# Patient Record
Sex: Female | Born: 1985 | Race: White | Hispanic: No | Marital: Single | Smoking: Current every day smoker
Health system: Southern US, Community
[De-identification: ages and names within clinical notes are randomized; demographics above are authoritative.]

## PROBLEM LIST (undated history)

## (undated) ENCOUNTER — Inpatient Hospital Stay (HOSPITAL_COMMUNITY): Payer: Self-pay

## (undated) DIAGNOSIS — D649 Anemia, unspecified: Secondary | ICD-10-CM

## (undated) DIAGNOSIS — N6310 Unspecified lump in the right breast, unspecified quadrant: Secondary | ICD-10-CM

## (undated) DIAGNOSIS — N200 Calculus of kidney: Secondary | ICD-10-CM

## (undated) HISTORY — DX: Unspecified lump in the right breast, unspecified quadrant: N63.10

## (undated) HISTORY — PX: NO PAST SURGERIES: SHX2092

## (undated) HISTORY — DX: Anemia, unspecified: D64.9

---

## 2007-04-26 ENCOUNTER — Emergency Department (HOSPITAL_COMMUNITY): Admission: EM | Admit: 2007-04-26 | Discharge: 2007-04-26 | Payer: Self-pay | Admitting: Emergency Medicine

## 2016-07-27 ENCOUNTER — Encounter: Payer: Self-pay | Admitting: Adult Health

## 2016-07-27 ENCOUNTER — Ambulatory Visit (INDEPENDENT_AMBULATORY_CARE_PROVIDER_SITE_OTHER): Payer: Self-pay | Admitting: Adult Health

## 2016-07-27 VITALS — BP 100/60 | HR 68 | Ht 64.0 in | Wt 138.0 lb

## 2016-07-27 DIAGNOSIS — O3680X0 Pregnancy with inconclusive fetal viability, not applicable or unspecified: Secondary | ICD-10-CM

## 2016-07-27 DIAGNOSIS — N926 Irregular menstruation, unspecified: Secondary | ICD-10-CM

## 2016-07-27 DIAGNOSIS — R110 Nausea: Secondary | ICD-10-CM | POA: Diagnosis not present

## 2016-07-27 DIAGNOSIS — Z3490 Encounter for supervision of normal pregnancy, unspecified, unspecified trimester: Secondary | ICD-10-CM

## 2016-07-27 DIAGNOSIS — Z3201 Encounter for pregnancy test, result positive: Secondary | ICD-10-CM | POA: Diagnosis not present

## 2016-07-27 LAB — POCT URINE PREGNANCY: PREG TEST UR: POSITIVE — AB

## 2016-07-27 MED ORDER — OB COMPLETE PETITE 35-5-1-200 MG PO CAPS: ORAL_CAPSULE | ORAL | 11 refills | Status: DC

## 2016-07-27 NOTE — Patient Instructions (Signed)
First Trimester of Pregnancy  The first trimester of pregnancy is from week 1 until the end of week 12 (months 1 through 3). A week after a sperm fertilizes an egg, the egg will implant on the wall of the uterus. This embryo will begin to develop into a baby. Genes from you and your partner are forming the baby. The female genes determine whether the baby is a boy or a girl. At 6-8 weeks, the eyes and face are formed, and the heartbeat can be seen on ultrasound. At the end of 12 weeks, all the baby's organs are formed.   Now that you are pregnant, you will want to do everything you can to have a healthy baby. Two of the most important things are to get good prenatal care and to follow your health care provider's instructions. Prenatal care is all the medical care you receive before the baby's birth. This care will help prevent, find, and treat any problems during the pregnancy and childbirth.  BODY CHANGES  Your body goes through many changes during pregnancy. The changes vary from woman to woman.   · You may gain or lose a couple of pounds at first.  · You may feel sick to your stomach (nauseous) and throw up (vomit). If the vomiting is uncontrollable, call your health care provider.  · You may tire easily.  · You may develop headaches that can be relieved by medicines approved by your health care provider.  · You may urinate more often. Painful urination may mean you have a bladder infection.  · You may develop heartburn as a result of your pregnancy.  · You may develop constipation because certain hormones are causing the muscles that push waste through your intestines to slow down.  · You may develop hemorrhoids or swollen, bulging veins (varicose veins).  · Your breasts may begin to grow larger and become tender. Your nipples may stick out more, and the tissue that surrounds them (areola) may become darker.  · Your gums may bleed and may be sensitive to brushing and flossing.   · Dark spots or blotches (chloasma, mask of pregnancy) may develop on your face. This will likely fade after the baby is born.  · Your menstrual periods will stop.  · You may have a loss of appetite.  · You may develop cravings for certain kinds of food.  · You may have changes in your emotions from day to day, such as being excited to be pregnant or being concerned that something may go wrong with the pregnancy and baby.  · You may have more vivid and strange dreams.  · You may have changes in your hair. These can include thickening of your hair, rapid growth, and changes in texture. Some women also have hair loss during or after pregnancy, or hair that feels dry or thin. Your hair will most likely return to normal after your baby is born.  WHAT TO EXPECT AT YOUR PRENATAL VISITS  During a routine prenatal visit:  · You will be weighed to make sure you and the baby are growing normally.  · Your blood pressure will be taken.  · Your abdomen will be measured to track your baby's growth.  · The fetal heartbeat will be listened to starting around week 10 or 12 of your pregnancy.  · Test results from any previous visits will be discussed.  Your health care provider may ask you:  · How you are feeling.  · If you   are feeling the baby move.  · If you have had any abnormal symptoms, such as leaking fluid, bleeding, severe headaches, or abdominal cramping.  · If you are using any tobacco products, including cigarettes, chewing tobacco, and electronic cigarettes.  · If you have any questions.  Other tests that may be performed during your first trimester include:  · Blood tests to find your blood type and to check for the presence of any previous infections. They will also be used to check for low iron levels (anemia) and Rh antibodies. Later in the pregnancy, blood tests for diabetes will be done along with other tests if problems develop.  · Urine tests to check for infections, diabetes, or protein in the urine.   · An ultrasound to confirm the proper growth and development of the baby.  · An amniocentesis to check for possible genetic problems.  · Fetal screens for spina bifida and Down syndrome.  · You may need other tests to make sure you and the baby are doing well.  · HIV (human immunodeficiency virus) testing. Routine prenatal testing includes screening for HIV, unless you choose not to have this test.  HOME CARE INSTRUCTIONS   Medicines  · Follow your health care provider's instructions regarding medicine use. Specific medicines may be either safe or unsafe to take during pregnancy.  · Take your prenatal vitamins as directed.  · If you develop constipation, try taking a stool softener if your health care provider approves.  Diet  · Eat regular, well-balanced meals. Choose a variety of foods, such as meat or vegetable-based protein, fish, milk and low-fat dairy products, vegetables, fruits, and whole grain breads and cereals. Your health care provider will help you determine the amount of weight gain that is right for you.  · Avoid raw meat and uncooked cheese. These carry germs that can cause birth defects in the baby.  · Eating four or five small meals rather than three large meals a day may help relieve nausea and vomiting. If you start to feel nauseous, eating a few soda crackers can be helpful. Drinking liquids between meals instead of during meals also seems to help nausea and vomiting.  · If you develop constipation, eat more high-fiber foods, such as fresh vegetables or fruit and whole grains. Drink enough fluids to keep your urine clear or pale yellow.  Activity and Exercise  · Exercise only as directed by your health care provider. Exercising will help you:    Control your weight.    Stay in shape.    Be prepared for labor and delivery.  · Experiencing pain or cramping in the lower abdomen or low back is a good sign that you should stop exercising. Check with your health care provider  before continuing normal exercises.  · Try to avoid standing for long periods of time. Move your legs often if you must stand in one place for a long time.  · Avoid heavy lifting.  · Wear low-heeled shoes, and practice good posture.  · You may continue to have sex unless your health care provider directs you otherwise.  Relief of Pain or Discomfort  · Wear a good support bra for breast tenderness.    · Take warm sitz baths to soothe any pain or discomfort caused by hemorrhoids. Use hemorrhoid cream if your health care provider approves.    · Rest with your legs elevated if you have leg cramps or low back pain.  · If you develop varicose veins in your   legs, wear support hose. Elevate your feet for 15 minutes, 3-4 times a day. Limit salt in your diet.  Prenatal Care  · Schedule your prenatal visits by the twelfth week of pregnancy. They are usually scheduled monthly at first, then more often in the last 2 months before delivery.  · Write down your questions. Take them to your prenatal visits.  · Keep all your prenatal visits as directed by your health care provider.  Safety  · Wear your seat belt at all times when driving.  · Make a list of emergency phone numbers, including numbers for family, friends, the hospital, and police and fire departments.  General Tips  · Ask your health care provider for a referral to a local prenatal education class. Begin classes no later than at the beginning of month 6 of your pregnancy.  · Ask for help if you have counseling or nutritional needs during pregnancy. Your health care provider can offer advice or refer you to specialists for help with various needs.  · Do not use hot tubs, steam rooms, or saunas.  · Do not douche or use tampons or scented sanitary pads.  · Do not cross your legs for long periods of time.  · Avoid cat litter boxes and soil used by cats. These carry germs that can cause birth defects in the baby and possibly loss of the fetus by miscarriage or stillbirth.   · Avoid all smoking, herbs, alcohol, and medicines not prescribed by your health care provider. Chemicals in these affect the formation and growth of the baby.  · Do not use any tobacco products, including cigarettes, chewing tobacco, and electronic cigarettes. If you need help quitting, ask your health care provider. You may receive counseling support and other resources to help you quit.  · Schedule a dentist appointment. At home, brush your teeth with a soft toothbrush and be gentle when you floss.  SEEK MEDICAL CARE IF:   · You have dizziness.  · You have mild pelvic cramps, pelvic pressure, or nagging pain in the abdominal area.  · You have persistent nausea, vomiting, or diarrhea.  · You have a bad smelling vaginal discharge.  · You have pain with urination.  · You notice increased swelling in your face, hands, legs, or ankles.  SEEK IMMEDIATE MEDICAL CARE IF:   · You have a fever.  · You are leaking fluid from your vagina.  · You have spotting or bleeding from your vagina.  · You have severe abdominal cramping or pain.  · You have rapid weight gain or loss.  · You vomit blood or material that looks like coffee grounds.  · You are exposed to German measles and have never had them.  · You are exposed to fifth disease or chickenpox.  · You develop a severe headache.  · You have shortness of breath.  · You have any kind of trauma, such as from a fall or a car accident.     This information is not intended to replace advice given to you by your health care provider. Make sure you discuss any questions you have with your health care provider.     Document Released: 08/17/2001 Document Revised: 09/13/2014 Document Reviewed: 07/03/2013  Elsevier Interactive Patient Education ©2017 [employer] Inc.

## 2016-07-27 NOTE — Progress Notes (Signed)
Subjective:     Patient ID: Brittney Duncan, female   DOB: 06/22/1986, 30 y.o.   MRN: [id number]  HPI Brittney Duncan is a 30 year old white female in for UPT, has missed period and had 3+HPT.She has nausea if eats hot dogs. She has had right breat mass for about 6 months and had US about 6 weeks ago, and was recommended to get biopsy and then found out she was pregnant and did not get.Will request those records.  Review of Systems +missed period Nausea  Reviewed past medical,surgical, social and family history. Reviewed medications and allergies.     Objective:   Physical Exam BP 100/60 (BP Location: Left Arm, Patient Position: Sitting, Cuff Size: Normal)   Pulse 68   Ht 5\' 4"  (1.626 m)   Wt 138 lb (62.6 kg)   LMP 05/24/2016 (Exact Date)   BMI 23.69 kg/m    UPT + about 9+1 week by LMP with EDD 02/28/17. Skin warm and dry. Neck: mid line trachea, normal thyroid, good ROM, no lymphadenopathy noted. Lungs: clear to ausculation bilaterally. Cardiovascular: regular rate and rhythm.Abdomen is soft and non tender. PHQ 2 score 0.  Assessment:     1. Pregnancy examination or test, positive result   2. Pregnancy, unspecified gestational age   763. Encounter to determine fetal viability of pregnancy, single or unspecified fetus       Plan:     Meds ordered this encounter  Medications  . Prenat-FeCbn-FeAspGl-FA-Omega (OB COMPLETE PETITE) 35-5-1-200 MG CAPS    Sig: Take 1 daily    Dispense:  30 capsule    Refill:  11    Order Specific Question:   Supervising Provider    Answer:   Lazaro ArmsEURE, LUTHER H [[id number]]  Return in 1 week for dating US Review handout on first trimester Decrease smoking Request records on breast mass

## 2016-08-02 ENCOUNTER — Ambulatory Visit (INDEPENDENT_AMBULATORY_CARE_PROVIDER_SITE_OTHER): Payer: Medicaid Other

## 2016-08-02 DIAGNOSIS — Z3A09 9 weeks gestation of pregnancy: Secondary | ICD-10-CM | POA: Diagnosis not present

## 2016-08-02 DIAGNOSIS — O3680X0 Pregnancy with inconclusive fetal viability, not applicable or unspecified: Secondary | ICD-10-CM

## 2016-08-02 NOTE — Progress Notes (Signed)
US 8+4 wks,single IUP w/ys pos fht 157 bpm,normal ov's bilat,crl 20.9 mm

## 2016-08-12 ENCOUNTER — Encounter: Payer: Self-pay | Admitting: Women's Health

## 2016-08-23 ENCOUNTER — Encounter: Payer: Self-pay | Admitting: Women's Health

## 2016-08-23 ENCOUNTER — Other Ambulatory Visit: Payer: Self-pay | Admitting: Women's Health

## 2016-08-23 DIAGNOSIS — Z3A12 12 weeks gestation of pregnancy: Secondary | ICD-10-CM

## 2016-08-23 DIAGNOSIS — Z3682 Encounter for antenatal screening for nuchal translucency: Secondary | ICD-10-CM

## 2016-08-26 ENCOUNTER — Ambulatory Visit (INDEPENDENT_AMBULATORY_CARE_PROVIDER_SITE_OTHER): Payer: Medicaid Other

## 2016-08-26 ENCOUNTER — Encounter: Payer: Self-pay | Admitting: Women's Health

## 2016-08-26 ENCOUNTER — Other Ambulatory Visit: Payer: Medicaid Other

## 2016-08-26 DIAGNOSIS — Z3682 Encounter for antenatal screening for nuchal translucency: Secondary | ICD-10-CM | POA: Diagnosis not present

## 2016-08-26 DIAGNOSIS — Z3481 Encounter for supervision of other normal pregnancy, first trimester: Secondary | ICD-10-CM

## 2016-08-26 DIAGNOSIS — Z3A12 12 weeks gestation of pregnancy: Secondary | ICD-10-CM

## 2016-08-26 NOTE — Progress Notes (Signed)
NT US today at 12+[redacted] weeks GA.  Single, active fetus with FHR 128 bpm.  CRL measures 59.8 mm which is consistent with dating. NT measures 1.1 mm and nasal bone is present.  Bilateral ovaries appear normal.

## 2016-08-28 LAB — MATERNAL SCREEN, INTEGRATED #1
CROWN RUMP LENGTH MAT SCREEN: 59.8 mm
GEST. AGE ON COLLECTION DATE: 12.4 wk
MATERNAL AGE AT EDD: 29.3 a
NUCHAL TRANSLUCENCY (NT): 1.1 mm
NUMBER OF FETUSES: 1
PAPP-A VALUE: 721.4 ng/mL
WEIGHT: 143 [lb_av]

## 2016-09-03 ENCOUNTER — Encounter: Payer: Self-pay | Admitting: Women's Health

## 2016-09-09 ENCOUNTER — Encounter: Payer: Self-pay | Admitting: Women's Health

## 2016-09-09 DIAGNOSIS — N6310 Unspecified lump in the right breast, unspecified quadrant: Secondary | ICD-10-CM | POA: Insufficient documentation

## 2016-09-13 ENCOUNTER — Encounter: Payer: Self-pay | Admitting: Women's Health

## 2016-09-23 ENCOUNTER — Encounter: Payer: Self-pay | Admitting: Women's Health

## 2016-10-04 ENCOUNTER — Ambulatory Visit (INDEPENDENT_AMBULATORY_CARE_PROVIDER_SITE_OTHER): Payer: Medicaid Other | Admitting: Women's Health

## 2016-10-04 ENCOUNTER — Encounter: Payer: Self-pay | Admitting: Women's Health

## 2016-10-04 VITALS — BP 109/54 | HR 90 | Wt 145.0 lb

## 2016-10-04 DIAGNOSIS — Z3490 Encounter for supervision of normal pregnancy, unspecified, unspecified trimester: Secondary | ICD-10-CM | POA: Insufficient documentation

## 2016-10-04 DIAGNOSIS — Z3482 Encounter for supervision of other normal pregnancy, second trimester: Secondary | ICD-10-CM

## 2016-10-04 DIAGNOSIS — Z3A17 17 weeks gestation of pregnancy: Secondary | ICD-10-CM | POA: Diagnosis not present

## 2016-10-04 DIAGNOSIS — F17200 Nicotine dependence, unspecified, uncomplicated: Secondary | ICD-10-CM | POA: Insufficient documentation

## 2016-10-04 DIAGNOSIS — Z331 Pregnant state, incidental: Secondary | ICD-10-CM | POA: Diagnosis not present

## 2016-10-04 DIAGNOSIS — Z363 Encounter for antenatal screening for malformations: Secondary | ICD-10-CM

## 2016-10-04 DIAGNOSIS — O99332 Smoking (tobacco) complicating pregnancy, second trimester: Secondary | ICD-10-CM | POA: Diagnosis not present

## 2016-10-04 DIAGNOSIS — Z1389 Encounter for screening for other disorder: Secondary | ICD-10-CM

## 2016-10-04 DIAGNOSIS — Z3682 Encounter for antenatal screening for nuchal translucency: Secondary | ICD-10-CM

## 2016-10-04 LAB — POCT URINALYSIS DIPSTICK
GLUCOSE UA: NEGATIVE
Ketones, UA: NEGATIVE
LEUKOCYTES UA: NEGATIVE
NITRITE UA: NEGATIVE
Protein, UA: NEGATIVE
RBC UA: NEGATIVE

## 2016-10-04 NOTE — Patient Instructions (Signed)
Nausea & Vomiting  Have saltine crackers or pretzels by your bed and eat a few bites before you raise your head out of bed in the morning  Eat small frequent meals throughout the day instead of large meals  Drink plenty of fluids throughout the day to stay hydrated, just don't drink a lot of fluids with your meals.  This can make your stomach fill up faster making you feel sick  Do not brush your teeth right after you eat  Products with real ginger are good for nausea, like ginger ale and ginger hard candy Make sure it says made with real ginger!  Sucking on sour candy like lemon heads is also good for nausea  If your prenatal vitamins make you nauseated, take them at night so you will sleep through the nausea  Sea Bands  If you feel like you need medicine for the nausea & vomiting please let us know  If you are unable to keep any fluids or food down please let us know   Second Trimester of Pregnancy The second trimester is from week 13 through week 28 (months 4 through 6). The second trimester is often a time when you feel your best. Your body has also adjusted to being pregnant, and you begin to feel better physically. Usually, morning sickness has lessened or quit completely, you may have more energy, and you may have an increase in appetite. The second trimester is also a time when the fetus is growing rapidly. At the end of the sixth month, the fetus is about 9 inches long and weighs about 1 pounds. You will likely begin to feel the baby move (quickening) between 18 and 20 weeks of the pregnancy. Body changes during your second trimester Your body continues to go through many changes during your second trimester. The changes vary from woman to woman.  Your weight will continue to increase. You will notice your lower abdomen bulging out.  You may begin to get stretch marks on your hips, abdomen, and breasts.  You may develop headaches that can be relieved by medicines. The  medicines should be approved by your health care provider.  You may urinate more often because the fetus is pressing on your bladder.  You may develop or continue to have heartburn as a result of your pregnancy.  You may develop constipation because certain hormones are causing the muscles that push waste through your intestines to slow down.  You may develop hemorrhoids or swollen, bulging veins (varicose veins).  You may have back pain. This is caused by:  Weight gain.  Pregnancy hormones that are relaxing the joints in your pelvis.  A shift in weight and the muscles that support your balance.  Your breasts will continue to grow and they will continue to become tender.  Your gums may bleed and may be sensitive to brushing and flossing.  Dark spots or blotches (chloasma, mask of pregnancy) may develop on your face. This will likely fade after the baby is born.  A dark line from your belly button to the pubic area (linea nigra) may appear. This will likely fade after the baby is born.  You may have changes in your hair. These can include thickening of your hair, rapid growth, and changes in texture. Some women also have hair loss during or after pregnancy, or hair that feels dry or thin. Your hair will most likely return to normal after your baby is born. What to expect at prenatal visits During  a routine prenatal visit:  You will be weighed to make sure you and the fetus are growing normally.  Your blood pressure will be taken.  Your abdomen will be measured to track your baby's growth.  The fetal heartbeat will be listened to.  Any test results from the previous visit will be discussed. Your health care provider may ask you:  How you are feeling.  If you are feeling the baby move.  If you have had any abnormal symptoms, such as leaking fluid, bleeding, severe headaches, or abdominal cramping.  If you are using any tobacco products, including cigarettes, chewing  tobacco, and electronic cigarettes.  If you have any questions. Other tests that may be performed during your second trimester include:  Blood tests that check for:  Low iron levels (anemia).  Gestational diabetes (between 24 and 28 weeks).  Rh antibodies. This is to check for a protein on red blood cells (Rh factor).  Urine tests to check for infections, diabetes, or protein in the urine.  An ultrasound to confirm the proper growth and development of the baby.  An amniocentesis to check for possible genetic problems.  Fetal screens for spina bifida and Down syndrome.  HIV (human immunodeficiency virus) testing. Routine prenatal testing includes screening for HIV, unless you choose not to have this test. Follow these instructions at home: Eating and drinking  Continue to eat regular, healthy meals.  Avoid raw meat, uncooked cheese, cat litter boxes, and soil used by cats. These carry germs that can cause birth defects in the baby.  Take your prenatal vitamins.  Take 1500-2000 mg of calcium daily starting at the 20th week of pregnancy until you deliver your baby.  If you develop constipation:  Take over-the-counter or prescription medicines.  Drink enough fluid to keep your urine clear or pale yellow.  Eat foods that are high in fiber, such as fresh fruits and vegetables, whole grains, and beans.  Limit foods that are high in fat and processed sugars, such as fried and sweet foods. Activity  Exercise only as directed by your health care provider. Experiencing uterine cramps is a good sign to stop exercising.  Avoid heavy lifting, wear low heel shoes, and practice good posture.  Wear your seat belt at all times when driving.  Rest with your legs elevated if you have leg cramps or low back pain.  Wear a good support bra for breast tenderness.  Do not use hot tubs, steam rooms, or saunas. Lifestyle  Avoid all smoking, herbs, alcohol, and unprescribed drugs. These  chemicals affect the formation and growth of the baby.  Do not use any products that contain nicotine or tobacco, such as cigarettes and e-cigarettes. If you need help quitting, ask your health care provider.  A sexual relationship may be continued unless your health care provider directs you otherwise. General instructions  Follow your health care provider's instructions regarding medicine use. There are medicines that are either safe or unsafe to take during pregnancy.  Take warm sitz baths to soothe any pain or discomfort caused by hemorrhoids. Use hemorrhoid cream if your health care provider approves.  If you develop varicose veins, wear support hose. Elevate your feet for 15 minutes, 3-4 times a day. Limit salt in your diet.  Visit your dentist if you have not gone yet during your pregnancy. Use a soft toothbrush to brush your teeth and be gentle when you floss.  Keep all follow-up prenatal visits as told by your health care provider.  This is important. Contact a health care provider if:  You have dizziness.  You have mild pelvic cramps, pelvic pressure, or nagging pain in the abdominal area.  You have persistent nausea, vomiting, or diarrhea.  You have a bad smelling vaginal discharge.  You have pain with urination. Get help right away if:  You have a fever.  You are leaking fluid from your vagina.  You have spotting or bleeding from your vagina.  You have severe abdominal cramping or pain.  You have rapid weight gain or weight loss.  You have shortness of breath with chest pain.  You notice sudden or extreme swelling of your face, hands, ankles, feet, or legs.  You have not felt your baby move in over an hour.  You have severe headaches that do not go away with medicine.  You have vision changes. Summary  The second trimester is from week 13 through week 28 (months 4 through 6). It is also a time when the fetus is growing rapidly.  Your body goes through  many changes during pregnancy. The changes vary from woman to woman.  Avoid all smoking, herbs, alcohol, and unprescribed drugs. These chemicals affect the formation and growth your baby.  Do not use any tobacco products, such as cigarettes, chewing tobacco, and e-cigarettes. If you need help quitting, ask your health care provider.  Contact your health care provider if you have any questions. Keep all prenatal visits as told by your health care provider. This is important. This information is not intended to replace advice given to you by your health care provider. Make sure you discuss any questions you have with your health care provider. Document Released: 08/17/2001 Document Revised: 01/29/2016 Document Reviewed: 10/24/2012 Elsevier Interactive Patient Education  2017 [employer]

## 2016-10-04 NOTE — Progress Notes (Signed)
  Subjective:  Brittney Duncan is a 31 y.o. 422P1001 Caucasian female at 118w4d by 8wk u/s, being seen today for her first obstetrical visit.  Her obstetrical history is significant for term uncomplicated svb, smoker- 1ppd prior to pregnancy now down to 1/2ppd and interested in quitting .  Pregnancy history fully reviewed.  Patient reports n/v- declines meds. Denies vb, cramping, uti s/s, abnormal/malodorous vag d/c, or vulvovaginal itching/irritation. Is feeling some fm.   BP (!) 109/54   Pulse 90   Wt 145 lb (65.8 kg)   LMP 05/24/2016 (Exact Date)   BMI 24.89 kg/m   HISTORY: OB History  Gravida Para Term Preterm AB Living  2 1 1     1   SAB TAB Ectopic Multiple Live Births          1    # Outcome Date GA Lbr Len/2nd Weight Sex Delivery Anes PTL Lv  2 Current           1 Term 07/12/09 3441w0d  7 lb 8 oz (3.402 kg) M Vag-Spont EPI N LIV     Past Medical History:  Diagnosis Date  . Anemia   . Breast mass, right    has had over 6 months, had US about 6 weeks ago, was recommeneded for bx, but found out was pregnant    History reviewed. No pertinent surgical history. Family History  Problem Relation Age of Onset  . Cancer Paternal Grandfather     stomach  . Heart attack Maternal Grandfather   . Heart attack Father     x 2  . Other Mother     vaginal polyps  . Cancer Mother     cervix, uterus  . Other Brother     3 cysts on brain; 3 dislocated disc in neck    Exam   System:     General: Well developed & nourished, no acute distress   Skin: Warm & dry, normal coloration and turgor, no rashes   Neurologic: Alert & oriented, normal mood   Cardiovascular: Regular rate & rhythm   Respiratory: Effort & rate normal, LCTAB, acyanotic   Abdomen: Soft, non tender   Extremities: normal strength, tone  Thin prep pap smear neg 2016 in [place]  FHR: 140 via doppler   Assessment:   Pregnancy: G2P1001 Patient Active Problem List   Diagnosis Date Noted  . Supervision of normal  pregnancy 10/04/2016  . Breast mass, right 09/09/2016    [redacted]w[redacted]d G2P1001 New OB visit Smoker  Plan:  Initial labs obtained Continue prenatal vitamins Problem list reviewed and updated Reviewed n/v relief measures and warning s/s to report Reviewed recommended weight gain based on pre-gravid BMI Encouraged well-balanced diet Genetic Screening discussed Integrated Screen: 1st it/nt last visit, doing 2nd IT today Cystic fibrosis screening discussed declined Ultrasound discussed; fetal survey: requested Follow up in 3 weeks for visit and anatomy u/s CCNC completed Smokes 1/2pp/day, advised cessation, discussed risks to fetus while pregnant, to infant pp, and to herself. Offered QuitlineNC, accepted, referral sent.     [name], Kimberly Randall CNM, [facility] IncWHNP-BC 10/04/2016 10:42 AM

## 2016-10-06 LAB — GC/CHLAMYDIA PROBE AMP
CHLAMYDIA, DNA PROBE: NEGATIVE
NEISSERIA GONORRHOEAE BY PCR: NEGATIVE

## 2016-10-06 LAB — URINE CULTURE

## 2016-10-10 LAB — MICROSCOPIC EXAMINATION
Casts: NONE SEEN /lpf
Epithelial Cells (non renal): >10 /hpf — AB (ref 0–10)

## 2016-10-10 LAB — PMP SCREEN PROFILE (10S), URINE
Amphetamine Screen, Ur: NEGATIVE ng/mL
BARBITURATE SCRN UR: NEGATIVE ng/mL
BENZODIAZEPINE SCREEN, URINE: NEGATIVE ng/mL
CREATININE(CRT), U: 134.4 mg/dL (ref 20.0–300.0)
Cannabinoids Ur Ql Scn: NEGATIVE ng/mL
Cocaine(Metab.)Screen, Urine: NEGATIVE ng/mL
Methadone Scn, Ur: NEGATIVE ng/mL
Opiate Scrn, Ur: NEGATIVE ng/mL
Oxycodone+Oxymorphone Ur Ql Scn: NEGATIVE ng/mL
PCP Scrn, Ur: NEGATIVE ng/mL
PH UR, DRUG SCRN: 7.6 (ref 4.5–8.9)
Propoxyphene, Screen: NEGATIVE ng/mL

## 2016-10-10 LAB — MATERNAL SCREEN, INTEGRATED #2
AFP MoM: 0.84
Alpha-Fetoprotein: 35.3 ng/mL
CROWN RUMP LENGTH: 59.8 mm
DIA MOM: 1.04
DIA VALUE: 188.7 pg/mL
Estriol, Unconjugated: 1.46 ng/mL
GEST. AGE ON COLLECTION DATE: 12.4 wk
GESTATIONAL AGE: 18 wk
HCG MOM: 0.69
Maternal Age at EDD: 29.3 years
NUCHAL TRANSLUCENCY MOM: 0.72
Nuchal Translucency (NT): 1.1 mm
Number of Fetuses: 1
PAPP-A MoM: 0.73
PAPP-A Value: 721.4 ng/mL
TEST RESULTS: NEGATIVE
WEIGHT: 143 [lb_av]
WEIGHT: 143 [lb_av]
hCG Value: 17.3 IU/mL
uE3 MoM: 1.16

## 2016-10-10 LAB — CBC
Hematocrit: 37.1 % (ref 34.0–46.6)
Hemoglobin: 12.7 g/dL (ref 11.1–15.9)
MCH: 32.5 pg (ref 26.6–33.0)
MCHC: 34.2 g/dL (ref 31.5–35.7)
MCV: 95 fL (ref 79–97)
PLATELETS: 273 10*3/uL (ref 150–379)
RBC: 3.91 x10E6/uL (ref 3.77–5.28)
RDW: 13.2 % (ref 12.3–15.4)
WBC: 13.4 10*3/uL — ABNORMAL HIGH (ref 3.4–10.8)

## 2016-10-10 LAB — URINALYSIS, ROUTINE W REFLEX MICROSCOPIC
BILIRUBIN UA: NEGATIVE
GLUCOSE, UA: NEGATIVE
KETONES UA: NEGATIVE
NITRITE UA: NEGATIVE
SPEC GRAV UA: 1.018 (ref 1.005–1.030)
UUROB: 0.2 mg/dL (ref 0.2–1.0)
pH, UA: 7.5 (ref 5.0–7.5)

## 2016-10-10 LAB — VARICELLA ZOSTER ANTIBODY, IGG: VARICELLA: 192 {index} (ref >165)

## 2016-10-10 LAB — HIV ANTIBODY (ROUTINE TESTING W REFLEX): HIV SCREEN 4TH GENERATION: NONREACTIVE

## 2016-10-10 LAB — ANTIBODY SCREEN: Antibody Screen: NEGATIVE

## 2016-10-10 LAB — ABO/RH: Rh Factor: POSITIVE

## 2016-10-10 LAB — RPR: RPR Ser Ql: NONREACTIVE

## 2016-10-10 LAB — RUBELLA SCREEN: Rubella Antibodies, IGG: 6.01 index (ref >0.99)

## 2016-10-10 LAB — HEPATITIS B SURFACE ANTIGEN: HEP B S AG: NEGATIVE

## 2016-10-25 ENCOUNTER — Encounter: Payer: Medicaid Other | Admitting: Obstetrics & Gynecology

## 2016-10-25 ENCOUNTER — Ambulatory Visit (INDEPENDENT_AMBULATORY_CARE_PROVIDER_SITE_OTHER): Payer: Medicaid Other

## 2016-10-25 ENCOUNTER — Other Ambulatory Visit: Payer: Medicaid Other

## 2016-10-25 DIAGNOSIS — Z3A21 21 weeks gestation of pregnancy: Secondary | ICD-10-CM | POA: Diagnosis not present

## 2016-10-25 DIAGNOSIS — Z363 Encounter for antenatal screening for malformations: Secondary | ICD-10-CM | POA: Diagnosis not present

## 2016-10-25 NOTE — Progress Notes (Addendum)
US 20+4 wks,cephalic,cx 3.8 cm,post pl gr 0,normal ov's bilat,fhr 129 bpm,svp of fluid 4.7 cm,efw 426 g,anatomy complete,no obvious abnormalities seen

## 2016-11-10 ENCOUNTER — Ambulatory Visit: Payer: Medicaid Other | Admitting: *Deleted

## 2016-11-10 ENCOUNTER — Encounter: Payer: Medicaid Other | Admitting: Advanced Practice Midwife

## 2017-01-02 ENCOUNTER — Encounter (HOSPITAL_COMMUNITY): Payer: Self-pay

## 2017-01-02 ENCOUNTER — Inpatient Hospital Stay (HOSPITAL_COMMUNITY)
Admission: AD | Admit: 2017-01-02 | Discharge: 2017-01-02 | Disposition: A | Discharge status: Home / Self Care | Payer: Medicaid Other | Source: Ambulatory Visit | Attending: Obstetrics & Gynecology | Admitting: Obstetrics & Gynecology

## 2017-01-02 DIAGNOSIS — O99333 Smoking (tobacco) complicating pregnancy, third trimester: Secondary | ICD-10-CM | POA: Diagnosis not present

## 2017-01-02 DIAGNOSIS — R102 Pelvic and perineal pain: Secondary | ICD-10-CM | POA: Diagnosis not present

## 2017-01-02 DIAGNOSIS — O368130 Decreased fetal movements, third trimester, not applicable or unspecified: Secondary | ICD-10-CM | POA: Insufficient documentation

## 2017-01-02 DIAGNOSIS — Z3A30 30 weeks gestation of pregnancy: Secondary | ICD-10-CM | POA: Diagnosis not present

## 2017-01-02 DIAGNOSIS — O26899 Other specified pregnancy related conditions, unspecified trimester: Secondary | ICD-10-CM | POA: Diagnosis not present

## 2017-01-02 DIAGNOSIS — O26893 Other specified pregnancy related conditions, third trimester: Secondary | ICD-10-CM | POA: Insufficient documentation

## 2017-01-02 LAB — URINALYSIS, ROUTINE W REFLEX MICROSCOPIC
Bacteria, UA: NONE SEEN
Bilirubin Urine: NEGATIVE
GLUCOSE, UA: NEGATIVE mg/dL
Hgb urine dipstick: NEGATIVE
KETONES UR: NEGATIVE mg/dL
NITRITE: NEGATIVE
Protein, ur: NEGATIVE mg/dL
Specific Gravity, Urine: 1.011 (ref 1.005–1.030)
pH: 7 (ref 5.0–8.0)

## 2017-01-02 NOTE — MAU Provider Note (Signed)
Chief Complaint  Patient presents with  . Decreased Fetal Movement  . Abdominal Pain    First Provider Initiated Contact with Patient 01/02/17 1246     S: Brittney Duncan  is a 31 y.o. y.o. year old G54P1001 female at [redacted]w[redacted]d weeks gestation who presents to MAU reporting decreased fetal movement since Yesterday And bilateral groin pain for the past few days. Has been walking a lot more than usual  Contractions: Possible Vaginal bleeding: Denies Leaking of fluid: Denies  Patient Active Problem List   Diagnosis Date Noted  . Supervision of normal pregnancy 10/04/2016  . Smoker 10/04/2016  . Breast mass, right 09/09/2016    O: No data found.  General: NAD Heart: Regular rate Lungs: Normal rate and effort Abd: Soft, NT, Gravid, S=D Pelvic: NEFG, no blood.  Dilation: Closed Cervical Position: Posterior Exam by:: Dorathy Kinsman  Unable to perform fetal fiber neck can do to recent intercourse less than 24 hours ago.  NST performed EFM: 130, reactive Toco: None  A: [redacted]w[redacted]d week IUP Decreased fetal movement that resolved in MAU with reactive NST. Fetal status reassuring.  Round ligament pains and possible Braxton Hicks contractions with long closed cervix and no evidence of active preterm labor.  P: Discharge home in stable condition. Preterm Labor precautions and fetal kick counts. Comfort measures, maternity support belt Follow-up as scheduled for prenatal visit or sooner as needed if symptoms worsen. Return to maternity admissions as needed if symptoms worsen.  [place], CNM 01/02/2017 12:45 PM  2

## 2017-01-02 NOTE — Discharge Instructions (Signed)
Braxton Hicks Contractions °Contractions of the uterus can occur throughout pregnancy, but they are not always a sign that you are in labor. You may have practice contractions called Braxton Hicks contractions. These false labor contractions are sometimes confused with true labor. °What are Braxton Hicks contractions? °Braxton Hicks contractions are tightening movements that occur in the muscles of the uterus before labor. Unlike true labor contractions, these contractions do not result in opening (dilation) and thinning of the cervix. Toward the end of pregnancy (32-34 weeks), Braxton Hicks contractions can happen more often and may become stronger. These contractions are sometimes difficult to tell apart from true labor because they can be very uncomfortable. You should not feel embarrassed if you go to the hospital with false labor. °Sometimes, the only way to tell if you are in true labor is for your health care provider to look for changes in the cervix. The health care provider will do a physical exam and may monitor your contractions. If you are not in true labor, the exam should show that your cervix is not dilating and your water has not broken. °If there are no prenatal problems or other health problems associated with your pregnancy, it is completely safe for you to be sent home with false labor. You may continue to have Braxton Hicks contractions until you go into true labor. °How can I tell the difference between true labor and false labor? °· Differences °¨ False labor °¨ Contractions last 30-70 seconds.: Contractions are usually shorter and not as strong as true labor contractions. °¨ Contractions become very regular.: Contractions are usually irregular. °¨ Discomfort is usually felt in the top of the uterus, and it spreads to the lower abdomen and low back.: Contractions are often felt in the front of the lower abdomen and in the groin. °¨ Contractions do not go away with walking.: Contractions may  go away when you walk around or change positions while lying down. °¨ Contractions usually become more intense and increase in frequency.: Contractions get weaker and are shorter-lasting as time goes on. °¨ The cervix dilates and gets thinner.: The cervix usually does not dilate or become thin. °Follow these instructions at home: °¨ Take over-the-counter and prescription medicines only as told by your health care provider. °¨ Keep up with your usual exercises and follow other instructions from your health care provider. °¨ Eat and drink lightly if you think you are going into labor. °¨ If Braxton Hicks contractions are making you uncomfortable: °¨ Change your position from lying down or resting to walking, or change from walking to resting. °¨ Sit and rest in a tub of warm water. °¨ Drink enough fluid to keep your urine clear or pale yellow. Dehydration may cause these contractions. °¨ Do slow and deep breathing several times an hour. °¨ Keep all follow-up prenatal visits as told by your health care provider. This is important. °Contact a health care provider if: °¨ You have a fever. °¨ You have continuous pain in your abdomen. °Get help right away if: °¨ Your contractions become stronger, more regular, and closer together. °¨ You have fluid leaking or gushing from your vagina. °¨ You pass blood-tinged mucus (bloody show). °¨ You have bleeding from your vagina. °¨ You have low back pain that you never had before. °¨ You feel your baby’s head pushing down and causing pelvic pressure. °¨ Your baby is not moving inside you as much as it used to. °Summary °¨ Contractions that occur before labor are   called Braxton Hicks contractions, false labor, or practice contractions.  Braxton Hicks contractions are usually shorter, weaker, farther apart, and less regular than true labor contractions. True labor contractions usually become progressively stronger and regular and they become more frequent.  Manage discomfort from  [facility] contractions by changing position, resting in a warm bath, drinking plenty of water, or practicing deep breathing. This information is not intended to replace advice given to you by your health care provider. Make sure you discuss any questions you have with your health care provider. Document Released: 08/23/2005 Document Revised: 07/12/2016 Document Reviewed: 07/12/2016 Elsevier Interactive Patient Education  2017 [employer]  Round Ligament Pain During Pregnancy   Round ligament pain is a sharp pain or jabbing feeling often felt in the lower belly or groin area on one or both sides. It is one of the most common complaints during pregnancy and is considered a normal part of pregnancy. It is most often felt during the second trimester.   Here is what you need to know about round ligament pain, including some tips to help you feel better.   Causes of Round Ligament Pain   Several thick ligaments surround and support your womb (uterus) as it grows during pregnancy. One of them is called the round ligament.   The round ligament connects the front part of the womb to your groin, the area where your legs attach to your pelvis. The round ligament normally tightens and relaxes slowly.   As your baby and womb grow, the round ligament stretches. That makes it more likely to become strained.   Sudden movements can cause the ligament to tighten quickly, like a rubber band snapping. This causes a sudden and quick jabbing feeling.   Symptoms of Round Ligament Pain   Round ligament pain can be concerning and uncomfortable. But it is considered normal as your body changes during pregnancy.   The symptoms of round ligament pain include a sharp, sudden spasm in the belly. It usually affects the right side, but it may happen on both sides. The pain only lasts a few seconds.   Exercise may cause the pain, as will rapid movements such as:  sneezing  coughing  laughing  rolling over in  bed  standing up too quickly   Treatment of Round Ligament Pain   Here are some tips that may help reduce your discomfort:   Pain relief. Take over-the-counter acetaminophen for pain, if necessary. Ask your doctor if this is OK.   Exercise. Get plenty of exercise to keep your stomach (core) muscles strong. Doing stretching exercises or prenatal yoga can be helpful. Ask your doctor which exercises are safe for you and your baby.   A helpful exercise involves putting your hands and knees on the floor, lowering your head, and pushing your backside into the air.   Avoid sudden movements. Change positions slowly (such as standing up or sitting down) to avoid sudden movements that may cause stretching and pain.   Flex your hips. Bend and flex your hips before you cough, sneeze, or laugh to avoid pulling on the ligaments.   Apply warmth. A heating pad or warm bath may be helpful. Ask your doctor if this is OK. Extreme heat can be dangerous to the baby.   You should try to modify your daily activity level and avoid positions that may worsen the condition.   When to Call the Doctor/Midwife   Always tell your doctor or midwife about any type of  pain you have during pregnancy. Round ligament pain is quick and doesn't last long.   Call your health care provider immediately if you have:  severe pain  fever  chills  pain on urination  difficulty walking   Belly pain during pregnancy can be due to many different causes. It is important for your doctor to rule out more serious conditions, including pregnancy complications such as placenta abruption or non-pregnancy illnesses such as:  inguinal hernia  appendicitis  stomach, liver, and kidney problems  Preterm labor pains may sometimes be mistaken for round ligament pain.

## 2017-01-02 NOTE — MAU Note (Addendum)
Pt presents to MAU with complaints of lower abdominal pain with a decrease in fetal movement since yesterday  Denies any VB reports clear vaginal discharge  Last time intercourse was day ago.   1259: SVE done bty CNM Sherolyn Buba.   1330: discharge instructions given with pt understanding. Pt left unit via ambulatory.

## 2017-01-24 ENCOUNTER — Encounter (HOSPITAL_COMMUNITY): Payer: Self-pay | Admitting: *Deleted

## 2017-01-24 ENCOUNTER — Inpatient Hospital Stay (HOSPITAL_COMMUNITY)
Admission: AD | Admit: 2017-01-24 | Discharge: 2017-01-24 | Disposition: A | Discharge status: Home / Self Care | Payer: Medicaid Other | Source: Ambulatory Visit | Attending: Obstetrics & Gynecology | Admitting: Obstetrics & Gynecology

## 2017-01-24 ENCOUNTER — Inpatient Hospital Stay (HOSPITAL_COMMUNITY): Payer: Medicaid Other

## 2017-01-24 DIAGNOSIS — Z87442 Personal history of urinary calculi: Secondary | ICD-10-CM | POA: Insufficient documentation

## 2017-01-24 DIAGNOSIS — O9989 Other specified diseases and conditions complicating pregnancy, childbirth and the puerperium: Secondary | ICD-10-CM

## 2017-01-24 DIAGNOSIS — N200 Calculus of kidney: Secondary | ICD-10-CM

## 2017-01-24 DIAGNOSIS — O2343 Unspecified infection of urinary tract in pregnancy, third trimester: Secondary | ICD-10-CM

## 2017-01-24 DIAGNOSIS — O4703 False labor before 37 completed weeks of gestation, third trimester: Secondary | ICD-10-CM

## 2017-01-24 DIAGNOSIS — R109 Unspecified abdominal pain: Secondary | ICD-10-CM | POA: Insufficient documentation

## 2017-01-24 DIAGNOSIS — N1330 Unspecified hydronephrosis: Secondary | ICD-10-CM | POA: Insufficient documentation

## 2017-01-24 HISTORY — DX: Calculus of kidney: N20.0

## 2017-01-24 LAB — CBC
HEMATOCRIT: 33.8 % — AB (ref 36.0–46.0)
Hemoglobin: 11.7 g/dL — ABNORMAL LOW (ref 12.0–15.0)
MCH: 33.2 pg (ref 26.0–34.0)
MCHC: 34.6 g/dL (ref 30.0–36.0)
MCV: 96 fL (ref 78.0–100.0)
PLATELETS: 222 10*3/uL (ref 150–400)
RBC: 3.52 MIL/uL — ABNORMAL LOW (ref 3.87–5.11)
RDW: 13.5 % (ref 11.5–15.5)
WBC: 14.1 10*3/uL — AB (ref 4.0–10.5)

## 2017-01-24 LAB — URINALYSIS, ROUTINE W REFLEX MICROSCOPIC
BILIRUBIN URINE: NEGATIVE
Glucose, UA: NEGATIVE mg/dL
KETONES UR: NEGATIVE mg/dL
NITRITE: POSITIVE — AB
PROTEIN: NEGATIVE mg/dL
SPECIFIC GRAVITY, URINE: 1.01 (ref 1.005–1.030)
pH: 7 (ref 5.0–8.0)

## 2017-01-24 LAB — URINALYSIS, MICROSCOPIC (REFLEX)

## 2017-01-24 LAB — FETAL FIBRONECTIN: FETAL FIBRONECTIN: NEGATIVE

## 2017-01-24 MED ORDER — DEXTROSE 5 % IV SOLN
2.0000 g | Freq: Once | INTRAVENOUS | Status: AC
  Administered 2017-01-24: 2 g via INTRAVENOUS
  Filled 2017-01-24: qty 2

## 2017-01-24 MED ORDER — SODIUM CHLORIDE 0.9 % IV BOLUS (SEPSIS)
1000.0000 mL | Freq: Once | INTRAVENOUS | Status: AC
  Administered 2017-01-24: 1000 mL via INTRAVENOUS

## 2017-01-24 MED ORDER — MORPHINE SULFATE (PF) 4 MG/ML IV SOLN
4.0000 mg | Freq: Once | INTRAVENOUS | Status: AC
  Administered 2017-01-24: 4 mg via INTRAVENOUS
  Filled 2017-01-24: qty 1

## 2017-01-24 MED ORDER — LACTATED RINGERS IV BOLUS (SEPSIS)
1000.0000 mL | Freq: Once | INTRAVENOUS | Status: AC
  Administered 2017-01-24: 1000 mL via INTRAVENOUS

## 2017-01-24 MED ORDER — PHENAZOPYRIDINE HCL 200 MG PO TABS: 200.0000 mg | ORAL_TABLET | Freq: Three times a day (TID) | ORAL | 0 refills | Status: DC

## 2017-01-24 MED ORDER — TAMSULOSIN HCL 0.4 MG PO CAPS
0.4000 mg | ORAL_CAPSULE | Freq: Every day | ORAL | Status: DC
  Administered 2017-01-24: 0.4 mg via ORAL
  Filled 2017-01-24 (×2): qty 1

## 2017-01-24 MED ORDER — PHENAZOPYRIDINE HCL 100 MG PO TABS
200.0000 mg | ORAL_TABLET | Freq: Three times a day (TID) | ORAL | Status: DC
  Administered 2017-01-24: 200 mg via ORAL
  Filled 2017-01-24: qty 2

## 2017-01-24 MED ORDER — LACTATED RINGERS IV BOLUS (SEPSIS)
1000.0000 mL | Freq: Once | INTRAVENOUS | Status: DC
Start: 2017-01-24 — End: 2017-01-24

## 2017-01-24 MED ORDER — CEPHALEXIN 500 MG PO CAPS: 500.0000 mg | ORAL_CAPSULE | Freq: Four times a day (QID) | ORAL | 0 refills | Status: DC

## 2017-01-24 MED ORDER — TAMSULOSIN HCL 0.4 MG PO CAPS: 0.4000 mg | ORAL_CAPSULE | Freq: Every day | ORAL | 0 refills | Status: DC

## 2017-01-24 MED ORDER — OXYCODONE-ACETAMINOPHEN 5-325 MG PO TABS: 1.0000 | ORAL_TABLET | Freq: Four times a day (QID) | ORAL | 0 refills | Status: DC | PRN

## 2017-01-24 NOTE — MAU Note (Signed)
Urine in lab 

## 2017-01-24 NOTE — MAU Note (Signed)
Noted blood in urine when voided this morning.  Having pain in RLQ, around to rt low back.  "being stabbed with a needle".  Hx of kidney stones, didn't feel like this

## 2017-02-16 ENCOUNTER — Telehealth: Payer: Self-pay | Admitting: *Deleted

## 2017-02-16 NOTE — Telephone Encounter (Signed)
Patient called stating she was having lower back pain, no bleeding or leaking fluid, baby moving. Advised she could try Tylenol, heating pad no longer than 20 minutes or warm bath. Advised to [facility] to [facility] if she started having strong, consistent contractions, bleeding or leaking. Also informed patient she needed to be seen since we have not seen her sine 17 weeks. Stated she wanted an appointment to see if she was dilated. Informed patient we needed to see her for routine PNV. Verbalized understanding. Appt made.

## 2017-02-17 ENCOUNTER — Ambulatory Visit (INDEPENDENT_AMBULATORY_CARE_PROVIDER_SITE_OTHER): Payer: Medicaid Other | Admitting: Women's Health

## 2017-02-17 ENCOUNTER — Encounter: Payer: Self-pay | Admitting: Women's Health

## 2017-02-17 VITALS — BP 80/60 | HR 74 | Wt 163.4 lb

## 2017-02-17 DIAGNOSIS — Z3A37 37 weeks gestation of pregnancy: Secondary | ICD-10-CM | POA: Diagnosis not present

## 2017-02-17 DIAGNOSIS — O0933 Supervision of pregnancy with insufficient antenatal care, third trimester: Secondary | ICD-10-CM

## 2017-02-17 DIAGNOSIS — Z720 Tobacco use: Secondary | ICD-10-CM | POA: Diagnosis not present

## 2017-02-17 DIAGNOSIS — Z1389 Encounter for screening for other disorder: Secondary | ICD-10-CM | POA: Diagnosis not present

## 2017-02-17 DIAGNOSIS — O99333 Smoking (tobacco) complicating pregnancy, third trimester: Secondary | ICD-10-CM

## 2017-02-17 DIAGNOSIS — F17200 Nicotine dependence, unspecified, uncomplicated: Secondary | ICD-10-CM

## 2017-02-17 DIAGNOSIS — O0930 Supervision of pregnancy with insufficient antenatal care, unspecified trimester: Secondary | ICD-10-CM

## 2017-02-17 DIAGNOSIS — Z331 Pregnant state, incidental: Secondary | ICD-10-CM

## 2017-02-17 DIAGNOSIS — Z3483 Encounter for supervision of other normal pregnancy, third trimester: Secondary | ICD-10-CM

## 2017-02-17 LAB — POCT URINALYSIS DIPSTICK
Blood, UA: NEGATIVE
Glucose, UA: NEGATIVE
KETONES UA: NEGATIVE
Leukocytes, UA: NEGATIVE
Nitrite, UA: NEGATIVE
Protein, UA: NEGATIVE

## 2017-02-17 NOTE — Patient Instructions (Addendum)
You will have your sugar test next visit.  Please do not eat or drink anything after midnight the night before you come, not even water.  You will be here for at least two hours.     Call the office [phone]) or go to [facility] if:  You begin to have strong, frequent contractions  Your water breaks.  Sometimes it is a big gush of fluid, sometimes it is just a trickle that keeps getting your panties wet or running down your legs  You have vaginal bleeding.  It is normal to have a small amount of spotting if your cervix was checked.   You don't feel your baby moving like normal.  If you don't, get you something to eat and drink and lay down and focus on feeling your baby move.  You should feel at least 10 movements in 2 hours.  If you don't, you should call the office or go to [facility].   [place] Pediatricians/Family Doctors:  [facility] [phone]            [facility] [phone]                 [facility] [phone] (usually not accepting new patients unless you have family there already, you are always welcome to call and ask)            [facility]ians/Family Doctors:   [facility]: [phone]  [facility]: [phone]      Deberah PeltonBraxton Hicks Contractions Contractions of the uterus can occur throughout pregnancy, but they are not always a sign that you are in labor. You may have practice contractions called Braxton Hicks contractions. These false labor contractions are sometimes confused with true labor. What are Deberah PeltonBraxton Hicks contractions? Braxton Hicks contractions are tightening movements that occur in the muscles of the uterus before labor. Unlike true labor contractions, these contractions do not result in opening (dilation) and thinning of the cervix. Toward the end of pregnancy (32-34 weeks), Braxton Hicks contractions can happen more often and may become stronger. These contractions  are sometimes difficult to tell apart from true labor because they can be very uncomfortable. You should not feel embarrassed if you go to the hospital with false labor. Sometimes, the only way to tell if you are in true labor is for your health care provider to look for changes in the cervix. The health care provider will do a physical exam and may monitor your contractions. If you are not in true labor, the exam should show that your cervix is not dilating and your water has not broken. If there are no prenatal problems or other health problems associated with your pregnancy, it is completely safe for you to be sent home with false labor. You may continue to have Braxton Hicks contractions until you go into true labor. How can I tell the difference between true labor and false labor?  Differences ? False labor ? Contractions last 30-70 seconds.: Contractions are usually shorter and not as strong as true labor contractions. ? Contractions become very regular.: Contractions are usually irregular. ? Discomfort is usually felt in the top of the uterus, and it spreads to the lower abdomen and low back.: Contractions are often felt in the front of the lower abdomen and in the groin. ? Contractions do not go away with walking.: Contractions may go away when you walk around or change positions while lying down. ? Contractions usually become more intense and increase in  frequency.: Contractions get weaker and are shorter-lasting as time goes on. ? The cervix dilates and gets thinner.: The cervix usually does not dilate or become thin. Follow these instructions at home:  Take over-the-counter and prescription medicines only as told by your health care provider.  Keep up with your usual exercises and follow other instructions from your health care provider.  Eat and drink lightly if you think you are going into labor.  If Braxton Hicks contractions are making you uncomfortable: ? Change your position  from lying down or resting to walking, or change from walking to resting. ? Sit and rest in a tub of warm water. ? Drink enough fluid to keep your urine clear or pale yellow. Dehydration may cause these contractions. ? Do slow and deep breathing several times an hour.  Keep all follow-up prenatal visits as told by your health care provider. This is important. Contact a health care provider if:  You have a fever.  You have continuous pain in your abdomen. Get help right away if:  Your contractions become stronger, more regular, and closer together.  You have fluid leaking or gushing from your vagina.  You pass blood-tinged mucus (bloody show).  You have bleeding from your vagina.  You have low back pain that you never had before.  You feel your baby's head pushing down and causing pelvic pressure.  Your baby is not moving inside you as much as it used to. Summary  Contractions that occur before labor are called Braxton Hicks contractions, false labor, or practice contractions.  Braxton Hicks contractions are usually shorter, weaker, farther apart, and less regular than true labor contractions. True labor contractions usually become progressively stronger and regular and they become more frequent.  Manage discomfort from [facility] contractions by changing position, resting in a warm bath, drinking plenty of water, or practicing deep breathing. This information is not intended to replace advice given to you by your health care provider. Make sure you discuss any questions you have with your health care provider. Document Released: 08/23/2005 Document Revised: 07/12/2016 Document Reviewed: 07/12/2016 Elsevier Interactive Patient Education  2017 [employer].

## 2017-02-17 NOTE — Progress Notes (Signed)
Low-risk OB appointment G2P1001 5754w0d Estimated Date of Delivery: 03/10/17 BP (!) 80/60   Pulse 74   Wt 163 lb 6.4 oz (74.1 kg)   LMP 05/24/2016 (Exact Date)   BMI 25.21 kg/m   BP, weight, and urine reviewed.  Refer to obstetrical flow sheet for FH & FHR.  Reports good fm.  Denies regular uc's, lof, vb, or uti s/s. No care since 17wk new ob visit- states problems w/ transportation. Reports she had 'kidney [facility] d/t kidney stones' [facility]. Had visit 4/29 for decreased fm- no mention of pyelo. Then visit 5/21 w/ renal u/s w/ Rt hydronephrosis no kidney stones, +nitrates on ua, no urine culture, and no MAU provider/or admit note.  GBS, gc/ct collected SVE per request: 4/th/-2, vtx Reviewed labor s/s, fkc. Plan:  Continue routine obstetrical care  F/U in asap for pn2 (no visit), then 1wk for OB appointment

## 2017-02-18 LAB — PMP SCREEN PROFILE (10S), URINE
Amphetamine Scrn, Ur: NEGATIVE ng/mL
BARBITURATE SCREEN URINE: NEGATIVE ng/mL
BENZODIAZEPINE SCREEN, URINE: NEGATIVE ng/mL
CANNABINOIDS UR QL SCN: NEGATIVE ng/mL
Cocaine (Metab) Scrn, Ur: NEGATIVE ng/mL
Creatinine(Crt), U: 63.4 mg/dL (ref 20.0–300.0)
Methadone Screen, Urine: NEGATIVE ng/mL
OXYCODONE+OXYMORPHONE UR QL SCN: NEGATIVE ng/mL
Opiate Scrn, Ur: NEGATIVE ng/mL
PH UR, DRUG SCRN: 7.7 (ref 4.5–8.9)
Phencyclidine Qn, Ur: NEGATIVE ng/mL
Propoxyphene Scrn, Ur: NEGATIVE ng/mL

## 2017-02-19 LAB — GC/CHLAMYDIA PROBE AMP
Chlamydia trachomatis, NAA: NEGATIVE
Neisseria gonorrhoeae by PCR: NEGATIVE

## 2017-02-19 LAB — STREP GP B NAA: Strep Gp B NAA: NEGATIVE

## 2017-02-21 ENCOUNTER — Other Ambulatory Visit: Payer: Medicaid Other

## 2017-02-24 ENCOUNTER — Ambulatory Visit (INDEPENDENT_AMBULATORY_CARE_PROVIDER_SITE_OTHER): Payer: Medicaid Other | Admitting: Advanced Practice Midwife

## 2017-02-24 ENCOUNTER — Encounter: Payer: Self-pay | Admitting: Advanced Practice Midwife

## 2017-02-24 VITALS — BP 112/82 | HR 70 | Wt 166.0 lb

## 2017-02-24 DIAGNOSIS — O0933 Supervision of pregnancy with insufficient antenatal care, third trimester: Secondary | ICD-10-CM | POA: Diagnosis not present

## 2017-02-24 DIAGNOSIS — Z131 Encounter for screening for diabetes mellitus: Secondary | ICD-10-CM | POA: Diagnosis not present

## 2017-02-24 DIAGNOSIS — O99333 Smoking (tobacco) complicating pregnancy, third trimester: Secondary | ICD-10-CM | POA: Diagnosis not present

## 2017-02-24 DIAGNOSIS — Z1389 Encounter for screening for other disorder: Secondary | ICD-10-CM

## 2017-02-24 DIAGNOSIS — Z3A38 38 weeks gestation of pregnancy: Secondary | ICD-10-CM

## 2017-02-24 DIAGNOSIS — Z331 Pregnant state, incidental: Secondary | ICD-10-CM | POA: Diagnosis not present

## 2017-02-24 DIAGNOSIS — Z3483 Encounter for supervision of other normal pregnancy, third trimester: Secondary | ICD-10-CM

## 2017-02-24 LAB — POCT URINALYSIS DIPSTICK
GLUCOSE UA: NEGATIVE
KETONES UA: NEGATIVE
Leukocytes, UA: NEGATIVE
Nitrite, UA: NEGATIVE
Protein, UA: NEGATIVE
RBC UA: NEGATIVE

## 2017-02-24 LAB — GLUCOSE, POCT (MANUAL RESULT ENTRY): POC Glucose: 84 mg/dl (ref 70–99)

## 2017-02-24 NOTE — Patient Instructions (Signed)

## 2017-02-24 NOTE — Progress Notes (Signed)
G2P1001 8515w0d Estimated Date of Delivery: 03/10/17  Blood pressure 112/82, pulse 70, weight 166 lb (75.3 kg), last menstrual period 05/24/2016.   BP weight and urine results all reviewed and noted. Never came for GTT.  CBG today 84. Has not eaten or had anything to drink today. Please refer to the obstetrical flow sheet for the fundal height and fetal heart rate documentation:  Patient reports good fetal movement, denies any bleeding and no rupture of membranes symptoms or regular contractions. Patient is without complaints. All questions were answered.  Orders Placed This Encounter  Procedures  . POCT urinalysis dipstick    Plan:  Continued routine obstetrical care, informed about SKAT and RCATS for transporation  Return for asap for gtt and 1 week for LROB.

## 2017-02-24 NOTE — Addendum Note (Signed)
Addended by: Moss McRESENZO, Elanore Talcott M on: 02/24/2017 03:13 PM   Modules accepted: Orders

## 2017-02-28 ENCOUNTER — Other Ambulatory Visit: Payer: Medicaid Other

## 2017-03-03 ENCOUNTER — Encounter: Payer: Medicaid Other | Admitting: Obstetrics & Gynecology

## 2017-03-07 ENCOUNTER — Telehealth: Payer: Self-pay | Admitting: Obstetrics & Gynecology

## 2017-03-07 ENCOUNTER — Other Ambulatory Visit: Payer: Self-pay | Admitting: Obstetrics & Gynecology

## 2017-03-07 MED ORDER — HYDROCORTISONE 2.5 % RE CREA: 1.0000 "application " | TOPICAL_CREAM | Freq: Two times a day (BID) | RECTAL | 1 refills | Status: DC

## 2017-03-07 NOTE — Telephone Encounter (Signed)
Pt called stating that she has given birth on 6/21 and has been suffering with bad hemorrhoids. Please contact pt

## 2017-03-07 NOTE — Telephone Encounter (Signed)
LMOVM that prescription for anusol cream had been [facility] [place].

## 2017-03-07 NOTE — Telephone Encounter (Signed)
Pt called stating that she has some hemorrhoids that have gotten worse after giving birth on 6/21. She states that she has tried OTC cream for them and that has helped a little. I advised pt to try taking an OTC non-stimulating stool softener, no straining, and witch hazel pads. I informed pt that I would see if a provider would send her in some cream for them. Advised pt to call us back if they worsened or showed no improvement after trying all the things that I suggested. Pt verbalized understanding.

## 2017-04-11 ENCOUNTER — Encounter: Payer: Self-pay | Admitting: *Deleted

## 2017-04-11 ENCOUNTER — Encounter: Payer: Self-pay | Admitting: Women's Health

## 2017-04-11 ENCOUNTER — Ambulatory Visit (INDEPENDENT_AMBULATORY_CARE_PROVIDER_SITE_OTHER): Payer: Medicaid Other | Admitting: *Deleted

## 2017-04-11 ENCOUNTER — Ambulatory Visit (INDEPENDENT_AMBULATORY_CARE_PROVIDER_SITE_OTHER): Payer: Medicaid Other | Admitting: Women's Health

## 2017-04-11 DIAGNOSIS — Z3202 Encounter for pregnancy test, result negative: Secondary | ICD-10-CM

## 2017-04-11 DIAGNOSIS — Z308 Encounter for other contraceptive management: Secondary | ICD-10-CM

## 2017-04-11 DIAGNOSIS — Z3042 Encounter for surveillance of injectable contraceptive: Secondary | ICD-10-CM | POA: Diagnosis not present

## 2017-04-11 LAB — POCT URINE PREGNANCY: PREG TEST UR: NEGATIVE

## 2017-04-11 MED ORDER — MEDROXYPROGESTERONE ACETATE 150 MG/ML IM SUSP
150.0000 mg | Freq: Once | INTRAMUSCULAR | Status: AC
  Administered 2017-04-11: 150 mg via INTRAMUSCULAR

## 2017-04-11 MED ORDER — MEDROXYPROGESTERONE ACETATE 150 MG/ML IM SUSP: 150.0000 mg | INTRAMUSCULAR | 3 refills | Status: AC

## 2017-04-11 NOTE — Progress Notes (Signed)
Pt here for Depo. Pt tolerated shot well. Return in 12 weeks for next shot. JSY 

## 2017-04-11 NOTE — Progress Notes (Signed)
Subjective:    Brittney Duncan is a 31 y.o. 72P2002 Caucasian female who presents for a postpartum visit. She is 6 weeks postpartum following a spontaneous vaginal [facility] at 38 gestational weeks [facility] [place], states she was unable to make [facility]. Was 4cm on arrival and had him w/in 45mins. Did have PPH, got 'a shot in my leg', no transfusion. Anesthesia: none. I have fully reviewed the prenatal and intrapartum course. Postpartum course has been uncomplicated. Baby's course has been uncomplicated. Baby is feeding by bottle. Bleeding on period- started 2 days ago. Bowel function is normal. Bladder function is normal. Patient is not sexually active. Last sexual activity: prior to birth of baby. Contraception method is wants depo. Postpartum depression screening: negative. Score 2.  [facility] [place] and was neg. Reports she had Rt breast mass just prior to finding out she was pregnant, was found by provider in [place], had u/s but was unable to have mammogram b/c she was pregnant.  We had u/s faxed to us at beginning of pregnancy, was [facility] and showed dense fibroglandular tissue, no cystic lesion or focal solid nodule, Birads 1. Pt states she hasn't felt for mass since she had baby, tried today and is unable to feel.   The following portions of the patient's history were reviewed and updated as appropriate: allergies, current medications, past medical history, past surgical history and problem list.  Review of Systems Pertinent items are noted in HPI.   Vitals:   04/11/17 1326  BP: 104/66  Pulse: 68  Weight: 148 lb (67.1 kg)   Patient's last menstrual period was 04/09/2017 (exact date).  Objective:   General:  alert, cooperative and no distress   Breasts:  deferred, no complaints. I do not palpate a mass or any abnormalities today.   Lungs: clear to auscultation bilaterally  Heart:  regular rate and rhythm  Abdomen: soft, nontender   Vulva: normal  Vagina: normal vagina  Cervix:  closed  Corpus: Well-involuted  Adnexa:  Non-palpable  Rectal Exam: No hemorrhoids        Assessment:   [facility]partum [facility] w/ PPH Bottlefeeding H/O Rt breast mass Depression screening Contraception counseling   Plan:  Contraception: rx depo w/ 3RF, condoms x 2wks Follow up in: today for 1st depo, then Jan for pap & physical, or earlier if needed Let us know if any changes/able to feel Rt breast mass  [name], Brittney Duncan CNM, [facility] 04/11/2017 1:57 PM

## 2017-04-11 NOTE — Patient Instructions (Signed)
Condoms x 2wks   Medroxyprogesterone injection [Contraceptive] What is this medicine? MEDROXYPROGESTERONE (me DROX ee proe JES te rone) contraceptive injections prevent pregnancy. They provide effective birth control for 3 months. Depo-subQ Provera 104 is also used for treating pain related to endometriosis. This medicine may be used for other purposes; ask your health care provider or pharmacist if you have questions. COMMON BRAND NAME(S): Depo-Provera, Depo-subQ Provera 104 What should I tell my health care provider before I take this medicine? They need to know if you have any of these conditions: -frequently drink alcohol -asthma -blood vessel disease or a history of a blood clot in the lungs or legs -bone disease such as osteoporosis -breast cancer -diabetes -eating disorder (anorexia nervosa or bulimia) -high blood pressure -HIV infection or AIDS -kidney disease -liver disease -mental depression -migraine -seizures (convulsions) -stroke -tobacco smoker -vaginal bleeding -an unusual or allergic reaction to medroxyprogesterone, other hormones, medicines, foods, dyes, or preservatives -pregnant or trying to get pregnant -breast-feeding How should I use this medicine? Depo-Provera Contraceptive injection is given into a muscle. Depo-subQ Provera 104 injection is given under the skin. These injections are given by a health care professional. You must not be pregnant before getting an injection. The injection is usually given during the first 5 days after the start of a menstrual period or 6 weeks after delivery of a baby. Talk to your pediatrician regarding the use of this medicine in children. Special care may be needed. These injections have been used in female children who have started having menstrual periods. Overdosage: If you think you have taken too much of this medicine contact a poison control center or emergency room at once. NOTE: This medicine is only for you. Do not  share this medicine with others. What if I miss a dose? Try not to miss a dose. You must get an injection once every 3 months to maintain birth control. If you cannot keep an appointment, call and reschedule it. If you wait longer than 13 weeks between Depo-Provera contraceptive injections or longer than 14 weeks between Depo-subQ Provera 104 injections, you could get pregnant. Use another method for birth control if you miss your appointment. You may also need a pregnancy test before receiving another injection. What may interact with this medicine? Do not take this medicine with any of the following medications: -bosentan This medicine may also interact with the following medications: -aminoglutethimide -antibiotics or medicines for infections, especially rifampin, rifabutin, rifapentine, and griseofulvin -aprepitant -barbiturate medicines such as phenobarbital or primidone -bexarotene -carbamazepine -medicines for seizures like ethotoin, felbamate, oxcarbazepine, phenytoin, topiramate -modafinil -St. John's wort This list may not describe all possible interactions. Give your health care provider a list of all the medicines, herbs, non-prescription drugs, or dietary supplements you use. Also tell them if you smoke, drink alcohol, or use illegal drugs. Some items may interact with your medicine. What should I watch for while using this medicine? This drug does not protect you against HIV infection (AIDS) or other sexually transmitted diseases. Use of this product may cause you to lose calcium from your bones. Loss of calcium may cause weak bones (osteoporosis). Only use this product for more than 2 years if other forms of birth control are not right for you. The longer you use this product for birth control the more likely you will be at risk for weak bones. Ask your health care professional how you can keep strong bones. You may have a change in bleeding pattern or irregular periods.  Many  females stop having periods while taking this drug. If you have received your injections on time, your chance of being pregnant is very low. If you think you may be pregnant, see your health care professional as soon as possible. Tell your health care professional if you want to get pregnant within the next year. The effect of this medicine may last a long time after you get your last injection. What side effects may I notice from receiving this medicine? Side effects that you should report to your doctor or health care professional as soon as possible: -allergic reactions like skin rash, itching or hives, swelling of the face, lips, or tongue -breast tenderness or discharge -breathing problems -changes in vision -depression -feeling faint or lightheaded, falls -fever -pain in the abdomen, chest, groin, or leg -problems with balance, talking, walking -unusually weak or tired -yellowing of the eyes or skin Side effects that usually do not require medical attention (report to your doctor or health care professional if they continue or are bothersome): -acne -fluid retention and swelling -headache -irregular periods, spotting, or absent periods -temporary pain, itching, or skin reaction at site where injected -weight gain This list may not describe all possible side effects. Call your doctor for medical advice about side effects. You may report side effects to FDA at 1-800-FDA-1088. Where should I keep my medicine? This does not apply. The injection will be given to you by a health care professional. NOTE: This sheet is a summary. It may not cover all possible information. If you have questions about this medicine, talk to your doctor, pharmacist, or health care provider.  2018 Elsevier/Gold Standard (2008-09-13 18:37:56)

## 2017-05-31 IMAGING — US US RENAL
1 series · 15 of 25 positions shown · non-contrast
Comparison: None.

CLINICAL DATA: Right flank pain with hematuria

EXAM:
RENAL / URINARY TRACT ULTRASOUND COMPLETE

[Series 1: us renal · 15 of 54 slices shown]
[im 1/54]
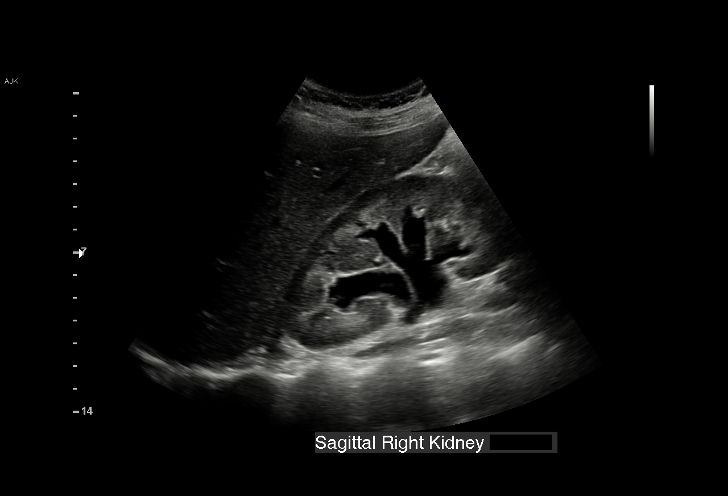
[im 5/54]
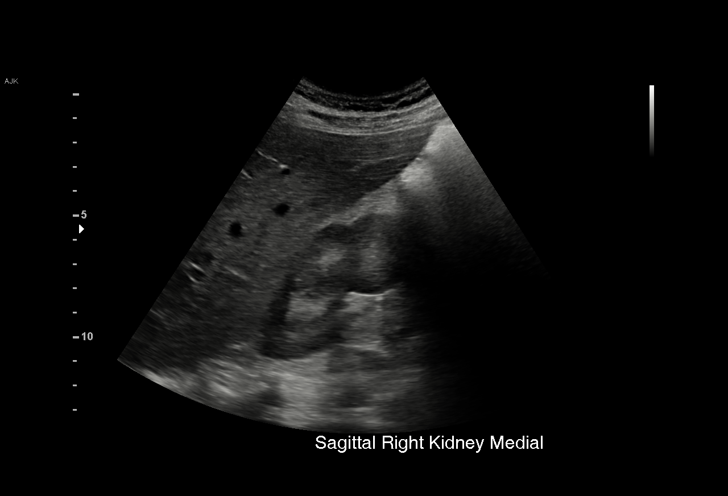
[im 9/54]
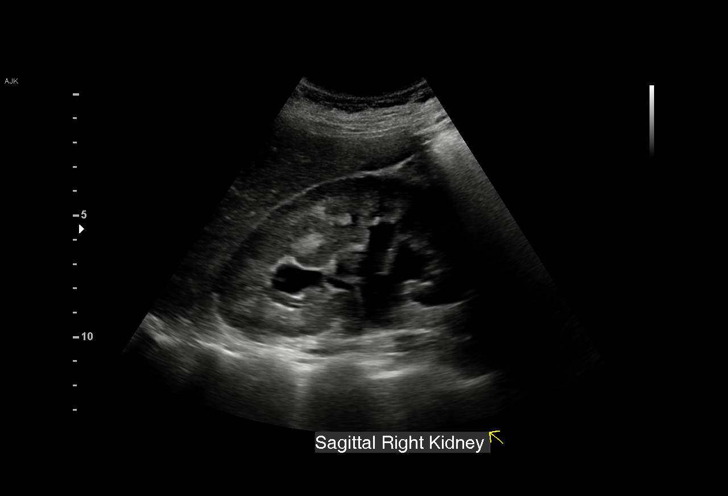
[im 12/54]
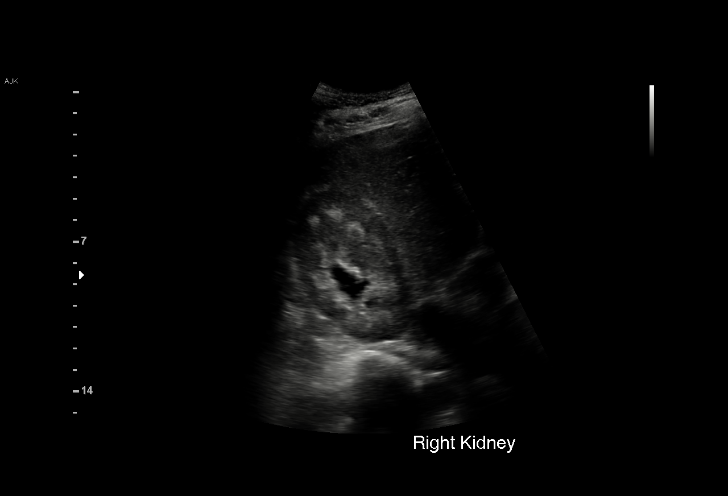
[im 16/54]
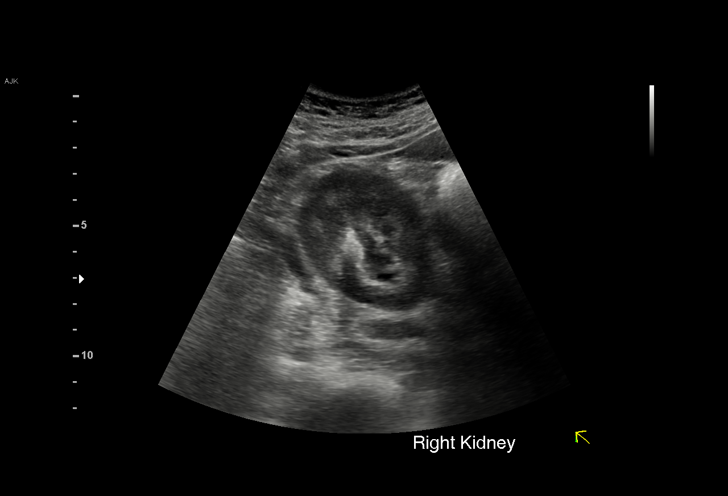
[im 20/54]
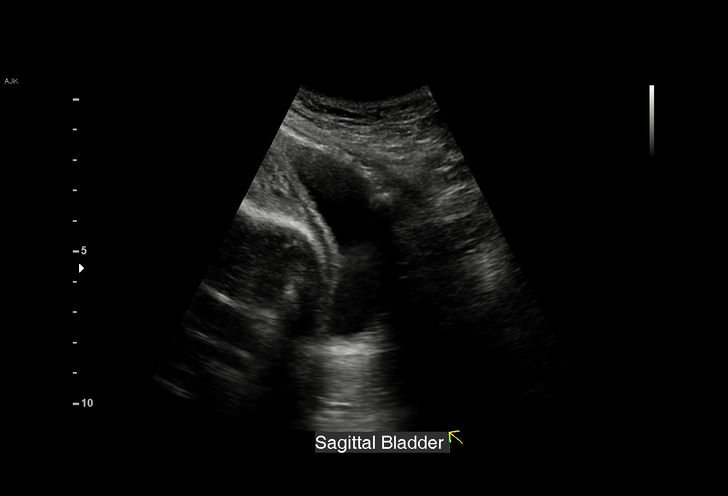
[im 23/54]
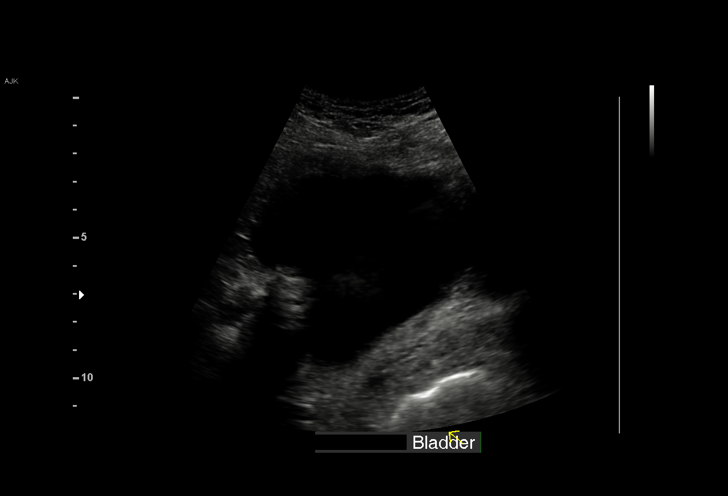
[im 27/54]
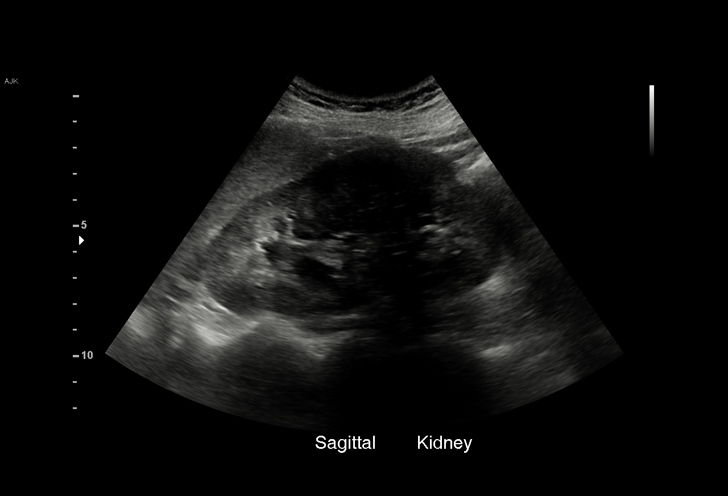
[im 31/54]
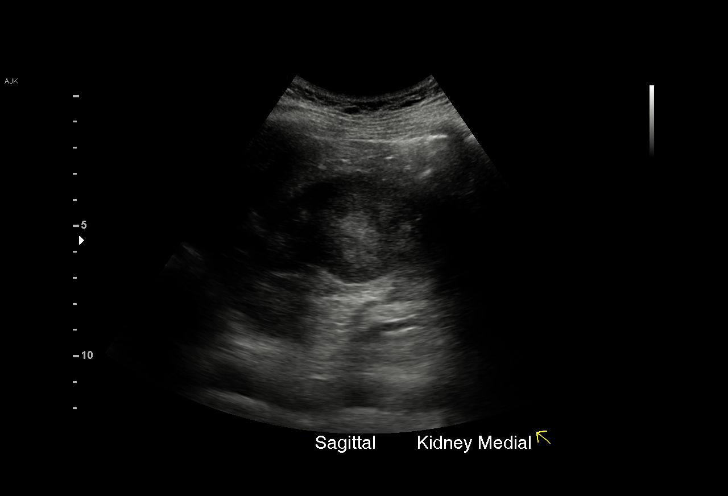
[im 34/54]
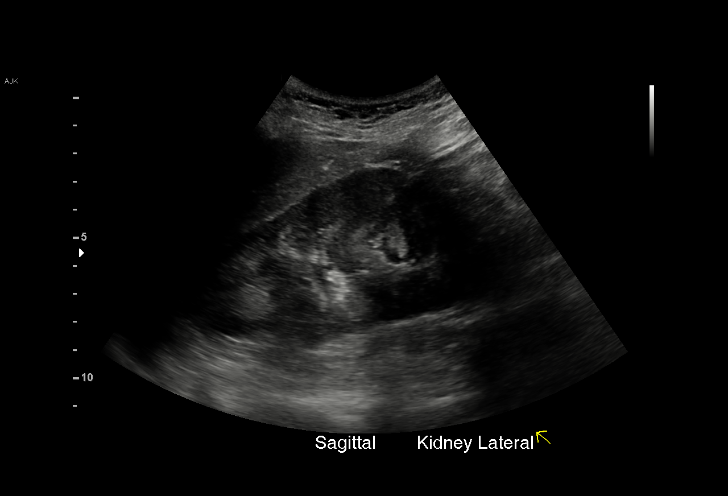
[im 38/54]
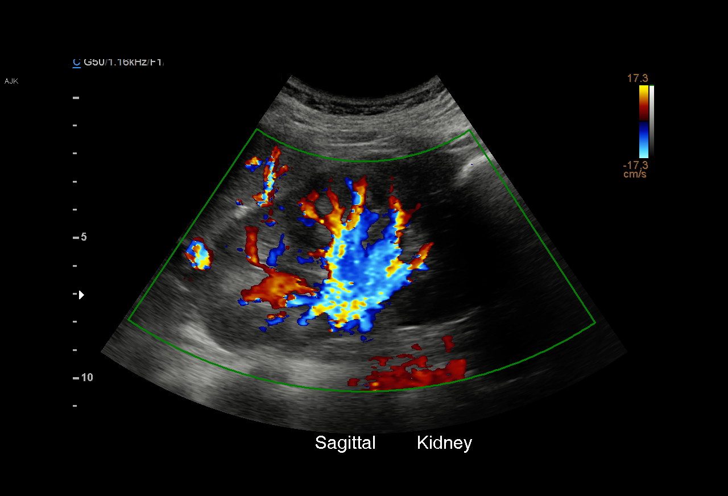
[im 42/54]
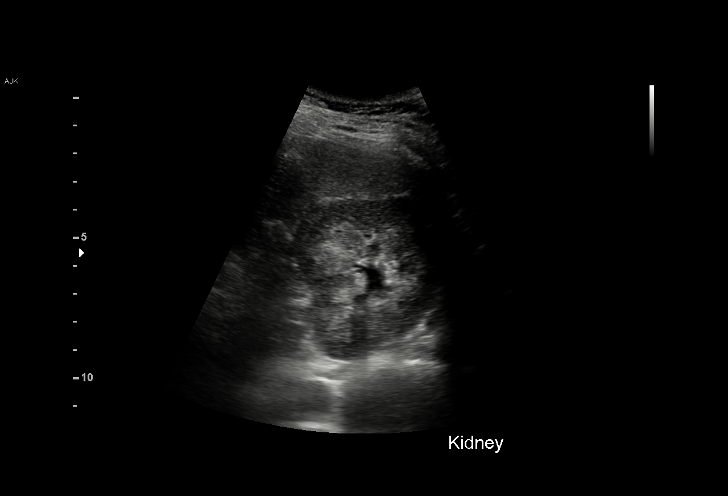
[im 45/54]
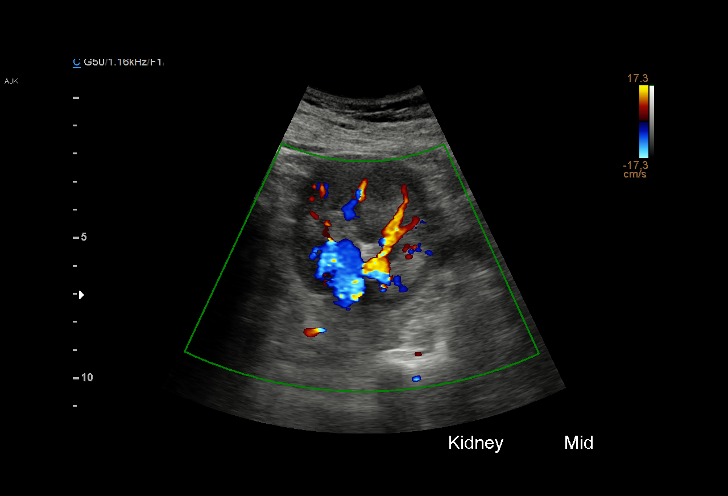
[im 49/54]
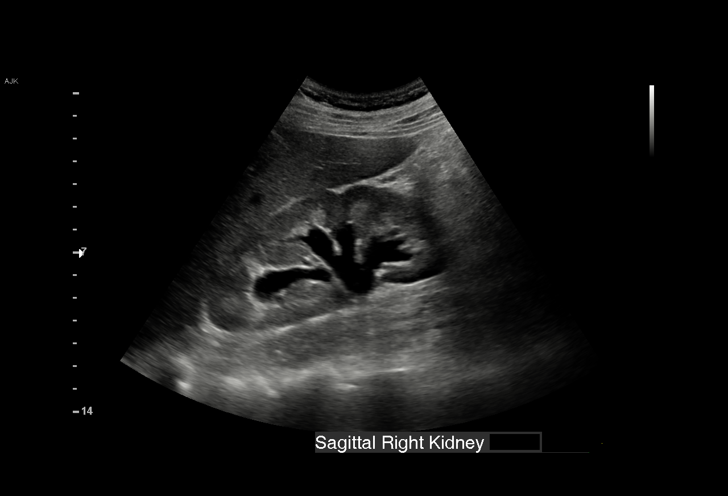
[im 54/54]
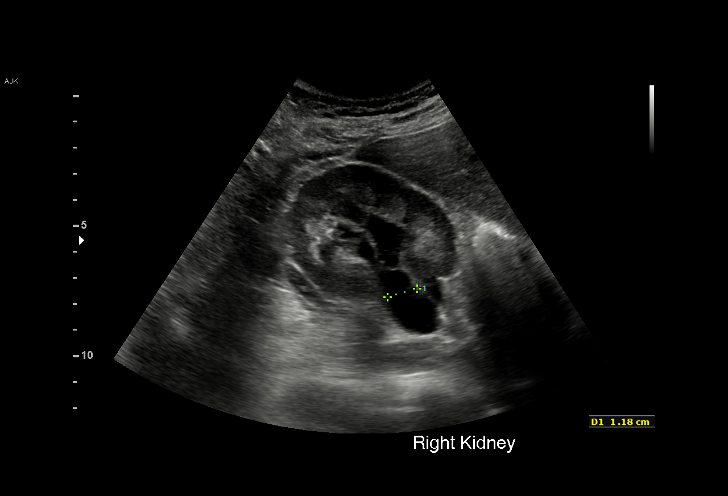

[15 of 25 positions shown; findings below may reference images not displayed]

FINDINGS: Right Kidney:

Length: 11.4 cm. Echogenicity within normal limits. Mild to moderate
right hydronephrosis. No focal mass. No shadowing stone.

Left Kidney:

Length: 11.8 cm. Echogenicity within normal limits. No mass or
hydronephrosis visualized.

Bladder:

Appears normal for degree of bladder distention.
IMPRESSION: 1. Mild to moderate right hydronephrosis
2. Normal left kidney

## 2017-07-04 ENCOUNTER — Ambulatory Visit: Payer: Medicaid Other

## 2017-07-04 ENCOUNTER — Encounter: Payer: Self-pay | Admitting: *Deleted

## 2024-03-04 ENCOUNTER — Emergency Department (HOSPITAL_COMMUNITY): Payer: Self-pay

## 2024-03-04 ENCOUNTER — Encounter (HOSPITAL_COMMUNITY): Payer: Self-pay

## 2024-03-04 ENCOUNTER — Other Ambulatory Visit: Payer: Self-pay

## 2024-03-04 ENCOUNTER — Emergency Department (HOSPITAL_COMMUNITY)
Admission: EM | Admit: 2024-03-04 | Discharge: 2024-03-04 | Disposition: A | Discharge status: Home / Self Care | Payer: Self-pay | Attending: Emergency Medicine | Admitting: Emergency Medicine

## 2024-03-04 DIAGNOSIS — N132 Hydronephrosis with renal and ureteral calculous obstruction: Secondary | ICD-10-CM | POA: Insufficient documentation

## 2024-03-04 LAB — URINALYSIS, ROUTINE W REFLEX MICROSCOPIC
Bacteria, UA: NONE SEEN
Bilirubin Urine: NEGATIVE
Glucose, UA: NEGATIVE mg/dL
Ketones, ur: NEGATIVE mg/dL
Leukocytes,Ua: NEGATIVE
Nitrite: NEGATIVE
Protein, ur: 100 mg/dL — AB
RBC / HPF: >50 RBC/hpf (ref 0–5)
Specific Gravity, Urine: 1.021 (ref 1.005–1.030)
pH: 6 (ref 5.0–8.0)

## 2024-03-04 LAB — CBC WITH DIFFERENTIAL/PLATELET
Abs Immature Granulocytes: 0.05 10*3/uL (ref 0.00–0.07)
Basophils Absolute: 0 10*3/uL (ref 0.0–0.1)
Basophils Relative: 0 %
Eosinophils Absolute: 0.1 10*3/uL (ref 0.0–0.5)
Eosinophils Relative: 1 %
HCT: 44.8 % (ref 36.0–46.0)
Hemoglobin: 15.5 g/dL — ABNORMAL HIGH (ref 12.0–15.0)
Immature Granulocytes: 0 %
Lymphocytes Relative: 21 %
Lymphs Abs: 2.7 10*3/uL (ref 0.7–4.0)
MCH: 31.6 pg (ref 26.0–34.0)
MCHC: 34.6 g/dL (ref 30.0–36.0)
MCV: 91.4 fL (ref 80.0–100.0)
Monocytes Absolute: 0.6 10*3/uL (ref 0.1–1.0)
Monocytes Relative: 5 %
Neutro Abs: 9.5 10*3/uL — ABNORMAL HIGH (ref 1.7–7.7)
Neutrophils Relative %: 73 %
Platelets: 291 10*3/uL (ref 150–400)
RBC: 4.9 MIL/uL (ref 3.87–5.11)
RDW: 12.2 % (ref 11.5–15.5)
WBC: 12.9 10*3/uL — ABNORMAL HIGH (ref 4.0–10.5)
nRBC: 0 % (ref 0.0–0.2)

## 2024-03-04 LAB — COMPREHENSIVE METABOLIC PANEL WITH GFR
ALT: 20 U/L (ref 0–44)
AST: 16 U/L (ref 15–41)
Albumin: 4.3 g/dL (ref 3.5–5.0)
Alkaline Phosphatase: 73 U/L (ref 38–126)
Anion gap: 9 (ref 5–15)
BUN: 18 mg/dL (ref 6–20)
CO2: 21 mmol/L — ABNORMAL LOW (ref 22–32)
Calcium: 9.4 mg/dL (ref 8.9–10.3)
Chloride: 108 mmol/L (ref 98–111)
Creatinine, Ser: 0.86 mg/dL (ref 0.44–1.00)
GFR, Estimated: >60 mL/min (ref >60)
Glucose, Bld: 109 mg/dL — ABNORMAL HIGH (ref 70–99)
Potassium: 3.7 mmol/L (ref 3.5–5.1)
Sodium: 138 mmol/L (ref 135–145)
Total Bilirubin: 0.7 mg/dL (ref 0.0–1.2)
Total Protein: 7.8 g/dL (ref 6.5–8.1)

## 2024-03-04 LAB — LIPASE, BLOOD: Lipase: 28 U/L (ref 11–51)

## 2024-03-04 LAB — HCG, SERUM, QUALITATIVE: Preg, Serum: NEGATIVE

## 2024-03-04 MED ORDER — IOHEXOL 300 MG/ML  SOLN
100.0000 mL | Freq: Once | INTRAMUSCULAR | Status: AC | PRN
  Administered 2024-03-04: 100 mL via INTRAVENOUS

## 2024-03-04 MED ORDER — DOCUSATE SODIUM 100 MG PO CAPS: 100.0000 mg | ORAL_CAPSULE | Freq: Two times a day (BID) | ORAL | 0 refills | Status: AC

## 2024-03-04 MED ORDER — TAMSULOSIN HCL 0.4 MG PO CAPS: 0.4000 mg | ORAL_CAPSULE | Freq: Every day | ORAL | 0 refills | Status: DC

## 2024-03-04 MED ORDER — OXYCODONE-ACETAMINOPHEN 5-325 MG PO TABS: 1.0000 | ORAL_TABLET | Freq: Four times a day (QID) | ORAL | 0 refills | Status: DC | PRN

## 2024-03-04 MED ORDER — ONDANSETRON HCL 4 MG/2ML IJ SOLN
4.0000 mg | Freq: Once | INTRAMUSCULAR | Status: AC
  Administered 2024-03-04: 4 mg via INTRAVENOUS
  Filled 2024-03-04: qty 2

## 2024-03-04 MED ORDER — KETOROLAC TROMETHAMINE 15 MG/ML IJ SOLN
15.0000 mg | Freq: Once | INTRAMUSCULAR | Status: AC
  Administered 2024-03-04: 15 mg via INTRAVENOUS
  Filled 2024-03-04: qty 1

## 2024-03-04 MED ORDER — MORPHINE SULFATE (PF) 4 MG/ML IV SOLN
4.0000 mg | Freq: Once | INTRAVENOUS | Status: AC
  Administered 2024-03-04: 4 mg via INTRAVENOUS
  Filled 2024-03-04: qty 1

## 2024-03-04 MED ORDER — SODIUM CHLORIDE 0.9 % IV BOLUS
1000.0000 mL | Freq: Once | INTRAVENOUS | Status: AC
  Administered 2024-03-04: 1000 mL via INTRAVENOUS

## 2024-03-04 MED ORDER — POLYETHYLENE GLYCOL 3350 17 G PO PACK: 17.0000 g | PACK | Freq: Every day | ORAL | 0 refills | Status: AC

## 2024-03-04 MED ORDER — ONDANSETRON 4 MG PO TBDP: 4.0000 mg | ORAL_TABLET | Freq: Three times a day (TID) | ORAL | 0 refills | Status: DC | PRN

## 2024-03-04 NOTE — ED Provider Notes (Signed)
 [place] EMERGENCY DEPARTMENT AT [facility] Provider Note   CSN: [id number] Arrival date & time: 03/04/24  1022     Patient presents with: Abdominal Pain and Flank Pain   Brittney Duncan is a 38 y.o. female.   Patient is a 38 year old female who presents emergency department the chief complaint of onset of left flank pain, nausea and vomiting this morning.  She notes that she has had no associated diarrhea, constipation, dysuria, hematuria.  She has had no recent falls or blunt abdominal wall trauma.  She denies any associated fever, chills, chest pain or shortness of breath.  She does admit to a history of kidney stones and diverticulosis.   Abdominal Pain Flank Pain Associated symptoms include abdominal pain.       Prior to Admission medications   Medication Sig Start Date End Date Taking? Authorizing Provider  medroxyPROGESTERone  (DEPO-PROVERA ) 150 MG/ML injection Inject 1 mL (150 mg total) into the muscle every 3 (three) months. 04/11/17   Kizzie Suzen SAUNDERS, CNM    Allergies: Patient has no known allergies.    Review of Systems  Gastrointestinal:  Positive for abdominal pain.  Genitourinary:  Positive for flank pain.  All other systems reviewed and are negative.   Updated Vital Signs BP (!) 103/56 (BP Location: Left Arm)   Pulse 65   Temp 98.1 F (36.7 C) (Oral)   Resp 20   Ht 5' 7 (1.702 m)   Wt 77.6 kg   SpO2 97%   BMI 26.78 kg/m   Physical Exam Vitals and nursing note reviewed.  Constitutional:      Appearance: Normal appearance.  HENT:     Head: Normocephalic and atraumatic.     Nose: Nose normal.     Mouth/Throat:     Mouth: Mucous membranes are moist.   Eyes:     Extraocular Movements: Extraocular movements intact.     Conjunctiva/sclera: Conjunctivae normal.     Pupils: Pupils are equal, round, and reactive to light.    Cardiovascular:     Rate and Rhythm: Normal rate and regular rhythm.     Pulses: Normal pulses.     Heart  sounds: Normal heart sounds. No murmur heard.    No friction rub. No gallop.  Pulmonary:     Effort: Pulmonary effort is normal. No respiratory distress.     Breath sounds: Normal breath sounds. No stridor. No wheezing, rhonchi or rales.  Abdominal:     General: Abdomen is flat. Bowel sounds are normal. There is no distension.     Palpations: Abdomen is soft.     Tenderness: There is abdominal tenderness in the left upper quadrant. There is no guarding. Negative signs include Murphy's sign and McBurney's sign.     Hernia: No hernia is present.   Musculoskeletal:        General: Normal range of motion.     Cervical back: Normal range of motion and neck supple.   Skin:    General: Skin is warm and dry.   Neurological:     General: No focal deficit present.     Mental Status: She is alert and oriented to person, place, and time. Mental status is at baseline.   Psychiatric:        Mood and Affect: Mood normal.        Behavior: Behavior normal.        Thought Content: Thought content normal.        Judgment: Judgment normal.     (  all labs ordered are listed, but only abnormal results are displayed) Labs Reviewed  COMPREHENSIVE METABOLIC PANEL WITH GFR - Abnormal; Notable for the following components:      Result Value   CO2 21 (*)    Glucose, Bld 109 (*)    All other components within normal limits  CBC WITH DIFFERENTIAL/PLATELET - Abnormal; Notable for the following components:   WBC 12.9 (*)    Hemoglobin 15.5 (*)    Neutro Abs 9.5 (*)    All other components within normal limits  URINALYSIS, ROUTINE W REFLEX MICROSCOPIC - Abnormal; Notable for the following components:   APPearance CLOUDY (*)    Hgb urine dipstick LARGE (*)    Protein, ur 100 (*)    All other components within normal limits  LIPASE, BLOOD  HCG, SERUM, QUALITATIVE    EKG: None  Radiology: CT ABDOMEN PELVIS W CONTRAST Result Date: 03/04/2024 CLINICAL DATA:  Left upper quadrant and left flank pain.  Nausea, vomiting and shortness of breath. History of kidney stones. EXAM: CT ABDOMEN AND PELVIS WITH CONTRAST TECHNIQUE: Multidetector CT imaging of the abdomen and pelvis was performed using the standard protocol following bolus administration of intravenous contrast. RADIATION DOSE REDUCTION: This exam was performed according to the departmental dose-optimization program which includes automated exposure control, adjustment of the mA and/or kV according to patient size and/or use of iterative reconstruction technique. CONTRAST:  100mL OMNIPAQUE IOHEXOL 300 MG/ML  SOLN COMPARISON:  None Available. FINDINGS: Lower chest: Nodule in the left lung base measures 4 mm, image 21/3. No acute abnormality. Hepatobiliary: No focal liver abnormality is seen. No gallstones, gallbladder wall thickening, or biliary dilatation. Pancreas: Unremarkable. No pancreatic ductal dilatation or surrounding inflammatory changes. Spleen: Normal in size without focal abnormality. Adrenals/Urinary Tract: Normal adrenal glands. Several punctate calculi are noted within the upper and lower pole right renal collecting system. No right hydronephrosis or mass. Multiple left renal calculi are identified. The largest is in the upper pole measuring 4 mm. There is asymmetric left-sided nephromegaly and edema with hydronephrosis. At the left UPJ there is a stone measuring 8 mm, image 54/4. No additional ureteral calculi noted. No left kidney mass. Bladder normal. Stomach/Bowel: Small hiatal hernia. The appendix is visualized and is normal. Prominent terminal ileum up to the ileocecal valve without wall thickening or inflammation measuring up to 2.3 cm. The a moderate stool burden is noted within the cecum and ascending colon which may account for this abnormality. No pathologic dilatation of the large or small bowel loops. Scattered colonic diverticula noted without signs of acute diverticulitis. Vascular/Lymphatic: No significant vascular findings are  present. No enlarged abdominal or pelvic lymph nodes. Reproductive: Uterus appears normal. Left ovary cyst appears well-circumscribed measuring 2.5 cm. Other: No free fluid or fluid collections.  No pneumoperitoneum. Musculoskeletal: No acute or suspicious osseous abnormality. IMPRESSION: 1. Left-sided obstructive uropathy with 8 mm stone at the left UPJ. 2. Bilateral nephrolithiasis. 3. Prominent terminal ileum up to the ileocecal valve without wall thickening or inflammation measuring up to 2.3 cm. A moderate stool burden is noted within the cecum and ascending colon which may account for this abnormality. 4. Left ovary cyst measures 2.5 cm. No follow-up imaging recommended. 5. Small hiatal hernia. 6. 4 mm left solid pulmonary nodule. No follow-up needed if patient is low-risk. Non-contrast chest CT can be considered in 12 months if patient is high-risk. This recommendation follows the consensus statement: Guidelines for Management of Incidental Pulmonary Nodules Detected on CT Images: From the  Fleischner Society 2017; Radiology 2017; [id number]. Electronically Signed   By: Waddell Calk M.D.   On: 03/04/2024 13:01     Procedures   Medications Ordered in the ED  morphine  (PF) 4 MG/ML injection 4 mg (4 mg Intravenous Given 03/04/24 1102)  ondansetron (ZOFRAN) injection 4 mg (4 mg Intravenous Given 03/04/24 1102)  ketorolac (TORADOL) 15 MG/ML injection 15 mg (15 mg Intravenous Given 03/04/24 1102)  sodium chloride  0.9 % bolus 1,000 mL (1,000 mLs Intravenous Bolus 03/04/24 1101)  iohexol (OMNIPAQUE) 300 MG/ML solution 100 mL (100 mLs Intravenous Contrast Given 03/04/24 1230)                                    Medical Decision Making Amount and/or Complexity of Data Reviewed Labs: ordered. Radiology: ordered.  Risk Prescription drug management.   This patient presents to the ED for concern of left abdominal pain and flank pain differential diagnosis includes acute appendicitis, cholecystitis,  cervalgia, diverticulitis, ovarian torsion cyst, PID, to an abscess, pyelonephritis, kidney stone, pancreatitis, mesenteric ischemia    Additional history obtained:  Additional history obtained from family External records from outside source obtained and reviewed including medical records   Lab Tests:  I Ordered, and personally interpreted labs.  The pertinent results include: Mild leukocytosis, no anemia, normal kidney function liver function, urinalysis with large blood, negative lipase   Imaging Studies ordered:  I ordered imaging studies including CT scan of abdomen and pelvis I independently visualized and interpreted imaging which showed 8 mm left UPJ ureteral stone, constipation, ovarian cyst, pulmonary nodule I agree with the radiologist interpretation   Medicines ordered and prescription drug management:  I ordered medication including morphine , Zofran, Toradol, IV fluids for flank pain ureteral stone Reevaluation of the patient after these medicines showed that the patient improved I have reviewed the patients home medicines and have made adjustments as needed   Problem List / ED Course:  Patient is doing well at this time and is stable for discharge home.  Discussed with patient that she does have a left-sided ureteral stone.  She was made aware of the ovarian cyst and pulmonary nodule as well.  Will treat her constipation.  Did recommend close follow-up for reevaluation of the ovarian cyst and pulmonary nodule.  Did discuss patient case with Dr. Alvaro with urology who notes the patient can follow-up in the office in the next 24 to 48 hours for reevaluation and possible lithotripsy.  He did recommend avoiding NSAIDs at this time.  Continued pain control was discussed and will be provided on outpatient basis.  Urinalysis demonstrated no indication of urinary tract infection.  She has no changes in kidney function.  Strict turn precautions were discussed for any new or  worsening symptoms.  Patient voiced understanding and had no additional questions.   Social Determinants of Health:  None        Final diagnoses:  None    ED Discharge Orders     None          Daralene Lonni JONETTA DEVONNA 03/04/24 1358    Suzette Pac, MD 03/05/24 [phone]

## 2024-03-04 NOTE — Discharge Instructions (Addendum)
 Please follow-up closely with urology within the next 1 to 2 days for reevaluation and possible lithotripsy.  Return to emergency department immediately for any new or worsening symptoms.  [facility] Primary Care Doctor List    Rollene Pesa, MD. Specialty: [facility] Medicine Contact information: [address]  [place] [address]  [phone]   Glendia Fielding, MD. Specialty: [facility] Medicine Contact information: [address]  [place] [address]  [phone]   Benita Outhouse, MD Specialty: Internal Medicine Contact information: [address] [place] [address]  [phone]   Darlyn Hurst, MD. Specialty: Internal Medicine Contact information: [address]  [place] [address]  [phone]    [facility] (Dr. Luke) Specialty: Family Medicine Contact information: [address]  [place] [address]  [phone]   Garnette Lolling, MD. Specialty: [facility] Medicine Contact information: [address]  [address]  [place] [address]  [phone]   Gaither Langton, MD. Specialty: Internal Medicine Contact information: [address]  [address]  [place] [address]  [phone]   [facility] Family Medicine: [address] [phone]  [place], Family medicine [address]  [phone]  [facility] [address] [place], [place] [phone]  [facility]: [address] [phone]    [facility]  [address] [place], [place] [address] [phone]  Services [facility] offers a variety of basic health services.  Services include but are not limited to: Blood pressure checks  Heart rate checks  Blood sugar checks  Urine analysis  Rapid strep tests  Pregnancy tests.  Health education and referrals  People needing more complex services will be directed to a physician online. Using these  virtual visits, doctors can evaluate and prescribe medicine and treatments. There will be no medication on-site, though [facility] will help patients fill their prescriptions at little to no cost.   For More information please go to: [email or web address]  Allergy and Asthma:    [address] [place] [phone]  Urology:  [address]  [place] [phone]  [facility]  [address] [place], [place] [phone]  Orthopedics   [address] [place], [place] [phone]  Endocrinology  [address] [place], [place] [phone]  Podiatry: [facility] [phone]

## 2024-03-04 NOTE — ED Triage Notes (Addendum)
 Pt from home complains of abdominal/flank pain, endorses nausea, vomiting, SOB and states she has a hx of kidney stones, diverticulosis.  Denies diarrhea, constipation. Pain started around 7 this morning.

## 2024-03-05 ENCOUNTER — Inpatient Hospital Stay (HOSPITAL_COMMUNITY): Admission: RE | Admit: 2024-03-05 | Source: Ambulatory Visit

## 2024-03-05 ENCOUNTER — Ambulatory Visit (HOSPITAL_COMMUNITY)
Admission: RE | Admit: 2024-03-05 | Discharge: 2024-03-05 | Disposition: A | Discharge status: Home / Self Care | Payer: Self-pay | Source: Ambulatory Visit | Attending: Urology | Admitting: Urology

## 2024-03-05 ENCOUNTER — Ambulatory Visit (INDEPENDENT_AMBULATORY_CARE_PROVIDER_SITE_OTHER): Payer: Self-pay | Admitting: Urology

## 2024-03-05 ENCOUNTER — Encounter: Payer: Self-pay | Admitting: Urology

## 2024-03-05 ENCOUNTER — Other Ambulatory Visit: Payer: Self-pay

## 2024-03-05 ENCOUNTER — Ambulatory Visit: Admitting: Urology

## 2024-03-05 VITALS — BP 100/65 | HR 92

## 2024-03-05 DIAGNOSIS — N200 Calculus of kidney: Secondary | ICD-10-CM | POA: Insufficient documentation

## 2024-03-05 DIAGNOSIS — N1330 Unspecified hydronephrosis: Secondary | ICD-10-CM | POA: Insufficient documentation

## 2024-03-05 DIAGNOSIS — R109 Unspecified abdominal pain: Secondary | ICD-10-CM

## 2024-03-05 DIAGNOSIS — N201 Calculus of ureter: Secondary | ICD-10-CM

## 2024-03-05 DIAGNOSIS — R8290 Unspecified abnormal findings in urine: Secondary | ICD-10-CM

## 2024-03-05 LAB — URINALYSIS, ROUTINE W REFLEX MICROSCOPIC
Bilirubin, UA: NEGATIVE
Glucose, UA: NEGATIVE
Ketones, UA: NEGATIVE
Nitrite, UA: NEGATIVE
Specific Gravity, UA: 1.03 (ref 1.005–1.030)
Urobilinogen, Ur: 1 mg/dL (ref 0.2–1.0)
pH, UA: 6 (ref 5.0–7.5)

## 2024-03-05 LAB — BLADDER SCAN AMB NON-IMAGING: Scan Result: 0

## 2024-03-05 LAB — MICROSCOPIC EXAMINATION: Epithelial Cells (non renal): >10 /HPF — AB (ref 0–10)

## 2024-03-05 NOTE — H&P (View-Only) (Signed)
 Name: Brittney Duncan DOB: 09-01-1986 MRN: [id number]  History of Present Illness: Ms. Brittney Duncan is a 38 y.o. female who presents today as a new patient at [facility]. All available relevant medical records have been reviewed.  Relevant History includes: 1. Kidney stone(s). She denies prior history of kidney stone procedure(s).  Recent history: > 03/04/2024 (yesterday): Seen in ER for left flank & abdominal pain. CMP with normal renal function (GFR >60; creatinine 0.86). CBC with mild leukocytosis (WBC 12.9). UA showed 11-20 WBC/hpf, >50 RBC/hpf, no bacteria. CT showed an obstructive 8 mm left UPJ stone with asymmetric left-sided nephromegaly and edema with hydronephrosis. Additional findings included bilateral nephrolithiasis, left ovarian cyst, 4 mm left pulmonary nodule as well, constipation.    She was discharged with prescriptions for Flomax , Zofran, Colace, Miralax, and Percocet.  Today: She reports ongoing left flank & abdominal pain. She denies fevers, nausea, or vomiting. She denies increased urinary urgency, frequency, dysuria, gross hematuria, hesitancy, straining to void, or sensations of incomplete emptying. She denies taking NSAIDs.  Medications: Current Outpatient Medications  Medication Sig Dispense Refill   docusate sodium (COLACE) 100 MG capsule Take 1 capsule (100 mg total) by mouth every 12 (twelve) hours. 60 capsule 0   ondansetron (ZOFRAN-ODT) 4 MG disintegrating tablet Take 1 tablet (4 mg total) by mouth every 8 (eight) hours as needed for nausea or vomiting. 20 tablet 0   oxyCODONE -acetaminophen  (PERCOCET/ROXICET) 5-325 MG tablet Take 1 tablet by mouth every 6 (six) hours as needed for severe pain (pain score 7-10). 15 tablet 0   polyethylene glycol (MIRALAX) 17 g packet Take 17 g by mouth daily. 14 each 0   tamsulosin  (FLOMAX ) 0.4 MG CAPS capsule Take 1 capsule (0.4 mg total) by mouth daily. 14 capsule 0   medroxyPROGESTERone  (DEPO-PROVERA ) 150  MG/ML injection Inject 1 mL (150 mg total) into the muscle every 3 (three) months. (Patient not taking: Reported on 03/05/2024) 1 mL 3   No current facility-administered medications for this visit.    Allergies: No Known Allergies  Past Medical History:  Diagnosis Date   Anemia    Breast mass, right    has had over 6 months, had US  about 6 weeks ago, was recommeneded for bx, but found out was pregnant    Kidney stone    Past Surgical History:  Procedure Laterality Date   NO PAST SURGERIES     Family History  Problem Relation Age of Onset   Cancer Paternal Grandfather        stomach   Heart disease Paternal Grandfather    Heart attack Maternal Grandfather    Heart attack Father        x 2   Heart disease Father 64   Other Mother        vaginal polyps   Other Brother        3 cysts on brain; 3 dislocated disc in neck   Heart disease Paternal Uncle 35       died massive heart attack   Social History   Socioeconomic History   Marital status: Single    Spouse name: Not on file   Number of children: Not on file   Years of education: Not on file   Highest education level: Not on file  Occupational History   Not on file  Tobacco Use   Smoking status: Every Day    Current packs/day: 0.50    Average packs/day: 0.5 packs/day for 12.0 years (6.0 ttl  pk-yrs)    Types: Cigarettes   Smokeless tobacco: Never  Substance and Sexual Activity   Alcohol use: No   Drug use: No   Sexual activity: Not Currently    Birth control/protection: Injection  Other Topics Concern   Not on file  Social History Narrative   Not on file   Social Drivers of Health   Financial Resource Strain: Not on file  Food Insecurity: Not on file  Transportation Needs: Not on file  Physical Activity: Not on file  Stress: Not on file  Social Connections: Not on file  Intimate Partner Violence: Not on file    SUBJECTIVE  Review of Systems Constitutional: Patient denies any unintentional weight  loss or change in strength lntegumentary: Patient denies any rashes or pruritus Cardiovascular: Patient denies chest pain or syncope Respiratory: Patient denies shortness of breath Gastrointestinal: As per HPI Musculoskeletal: Patient denies muscle cramps or weakness Neurologic: Patient denies convulsions or seizures Allergic/Immunologic: Patient denies recent allergic reaction(s) Hematologic/Lymphatic: Patient denies bleeding tendencies Endocrine: Patient denies heat/cold intolerance  GU: As per HPI.  OBJECTIVE Vitals:   03/05/24 1116  BP: 100/65  Pulse: 92   There is no height or weight on file to calculate BMI.  Physical Examination Constitutional: No obvious distress; patient is non-toxic appearing  Cardiovascular: No visible lower extremity edema.  Respiratory: The patient does not have audible wheezing/stridor; respirations do not appear labored  Gastrointestinal: Abdomen non-distended Musculoskeletal: Normal ROM of UEs  Skin: No obvious rashes/open sores  Neurologic: CN 2-12 grossly intact Psychiatric: Answered questions appropriately with normal affect  Hematologic/Lymphatic/Immunologic: No obvious bruises or sites of spontaneous bleeding  Urine microscopy: 6-10 WBC/hpf, 3-10 RBC/hpf, moderate bacteria PVR: 0 ml  ASSESSMENT Left ureteral stone - Plan: Urine culture, DG Abd 1 View, Ambulatory Referral For Surgery Scheduling  Kidney stones - Plan: Urinalysis, Routine w reflex microscopic, BLADDER SCAN AMB NON-IMAGING, Urine culture, DG Abd 1 View, Ambulatory Referral For Surgery Scheduling  Hydronephrosis of left kidney - Plan: Urine culture, DG Abd 1 View, Ambulatory Referral For Surgery Scheduling  Left flank pain - Plan: Urine culture, DG Abd 1 View, Ambulatory Referral For Surgery Scheduling  Abnormal urinalysis - Plan: Urine culture  Urine culture sent.  For acute GU stone symptoms we agreed to proceed with: - Flomax  0.4 mg daily for medical expulsive  therapy (MET), which may improve passage of stone(s).  - For pain management, we discussed the use of opioids versus OTC analgesics.  - Zofran for nausea / vomiting.  We discussed the various treatment options including extracorporeal shock wave lithotripsy (ESWL) or ureteroscopic stone manipulation (URS). We discussed possible risks and benefits of intervention including but not limited to: including pain, infection, sepsis, UTI, ureter perforation, need for stenting, post-op ureteral stricture, hematuria. Agreed to proceed with ESWL.   She verbalized understanding and agreement. All questions were answered.   PLAN Advised the following: Urine culture sent. KUB today. Analgesics PRN for pain. Zofran PRN for nausea. Return for surgery.  Orders Placed This Encounter  Procedures   Urine culture   DG Abd 1 View    Standing Status:   Future    Expected Date:   03/05/2024    Expiration Date:   03/05/2025    Reason for Exam (SYMPTOM  OR DIAGNOSIS REQUIRED):   kidney stone    Is patient pregnant?:   Unknown (Please Explain)    Preferred imaging location?:   [facility]   Urinalysis, Routine w reflex microscopic  Ambulatory Referral For Surgery Scheduling    Referral Priority:   Urgent    Referral Type:   Consultation    Referred to Provider:   Sherrilee Belvie CROME, MD    Number of Visits Requested:   1   BLADDER SCAN AMB NON-IMAGING    It has been explained that the patient is to follow regularly with their PCP in addition to all other providers involved in their care and to follow instructions provided by these respective offices. Patient advised to contact urology clinic if any urologic-pertaining questions, concerns, new symptoms or problems arise in the interim period.  There are no Patient Instructions on file for this visit.  Electronically signed by: Lauraine KYM Oz, MSN, FNP-C, CUNP 03/05/2024 12:07 PM

## 2024-03-05 NOTE — Progress Notes (Signed)
 Name: Brittney Duncan DOB: 1985/10/27 MRN: [id number]  History of Present Illness: Ms. Brittney Duncan is a 38 y.o. female who presents today as a new patient at [facility]. All available relevant medical records have been reviewed.  Relevant History includes: 1. Kidney stone(s). She denies prior history of kidney stone procedure(s).  Recent history: > 03/04/2024 (yesterday): Seen in ER for left flank & abdominal pain. CMP with normal renal function (GFR >60; creatinine 0.86). CBC with mild leukocytosis (WBC 12.9). UA showed 11-20 WBC/hpf, >50 RBC/hpf, no bacteria. CT showed an obstructive 8 mm left UPJ stone with asymmetric left-sided nephromegaly and edema with hydronephrosis. Additional findings included bilateral nephrolithiasis, left ovarian cyst, 4 mm left pulmonary nodule as well, constipation.    She was discharged with prescriptions for Flomax , Zofran, Colace, Miralax, and Percocet.  Today: She reports ongoing left flank & abdominal pain. She denies fevers, nausea, or vomiting. She denies increased urinary urgency, frequency, dysuria, gross hematuria, hesitancy, straining to void, or sensations of incomplete emptying. She denies taking NSAIDs.  Medications: Current Outpatient Medications  Medication Sig Dispense Refill   docusate sodium (COLACE) 100 MG capsule Take 1 capsule (100 mg total) by mouth every 12 (twelve) hours. 60 capsule 0   ondansetron (ZOFRAN-ODT) 4 MG disintegrating tablet Take 1 tablet (4 mg total) by mouth every 8 (eight) hours as needed for nausea or vomiting. 20 tablet 0   oxyCODONE -acetaminophen  (PERCOCET/ROXICET) 5-325 MG tablet Take 1 tablet by mouth every 6 (six) hours as needed for severe pain (pain score 7-10). 15 tablet 0   polyethylene glycol (MIRALAX) 17 g packet Take 17 g by mouth daily. 14 each 0   tamsulosin  (FLOMAX ) 0.4 MG CAPS capsule Take 1 capsule (0.4 mg total) by mouth daily. 14 capsule 0   medroxyPROGESTERone  (DEPO-PROVERA ) 150  MG/ML injection Inject 1 mL (150 mg total) into the muscle every 3 (three) months. (Patient not taking: Reported on 03/05/2024) 1 mL 3   No current facility-administered medications for this visit.    Allergies: No Known Allergies  Past Medical History:  Diagnosis Date   Anemia    Breast mass, right    has had over 6 months, had US  about 6 weeks ago, was recommeneded for bx, but found out was pregnant    Kidney stone    Past Surgical History:  Procedure Laterality Date   NO PAST SURGERIES     Family History  Problem Relation Age of Onset   Cancer Paternal Grandfather        stomach   Heart disease Paternal Grandfather    Heart attack Maternal Grandfather    Heart attack Father        x 2   Heart disease Father 43   Other Mother        vaginal polyps   Other Brother        3 cysts on brain; 3 dislocated disc in neck   Heart disease Paternal Uncle 35       died massive heart attack   Social History   Socioeconomic History   Marital status: Single    Spouse name: Not on file   Number of children: Not on file   Years of education: Not on file   Highest education level: Not on file  Occupational History   Not on file  Tobacco Use   Smoking status: Every Day    Current packs/day: 0.50    Average packs/day: 0.5 packs/day for 12.0 years (6.0 ttl  pk-yrs)    Types: Cigarettes   Smokeless tobacco: Never  Substance and Sexual Activity   Alcohol use: No   Drug use: No   Sexual activity: Not Currently    Birth control/protection: Injection  Other Topics Concern   Not on file  Social History Narrative   Not on file   Social Drivers of Health   Financial Resource Strain: Not on file  Food Insecurity: Not on file  Transportation Needs: Not on file  Physical Activity: Not on file  Stress: Not on file  Social Connections: Not on file  Intimate Partner Violence: Not on file    SUBJECTIVE  Review of Systems Constitutional: Patient denies any unintentional weight  loss or change in strength lntegumentary: Patient denies any rashes or pruritus Cardiovascular: Patient denies chest pain or syncope Respiratory: Patient denies shortness of breath Gastrointestinal: As per HPI Musculoskeletal: Patient denies muscle cramps or weakness Neurologic: Patient denies convulsions or seizures Allergic/Immunologic: Patient denies recent allergic reaction(s) Hematologic/Lymphatic: Patient denies bleeding tendencies Endocrine: Patient denies heat/cold intolerance  GU: As per HPI.  OBJECTIVE Vitals:   03/05/24 1116  BP: 100/65  Pulse: 92   There is no height or weight on file to calculate BMI.  Physical Examination Constitutional: No obvious distress; patient is non-toxic appearing  Cardiovascular: No visible lower extremity edema.  Respiratory: The patient does not have audible wheezing/stridor; respirations do not appear labored  Gastrointestinal: Abdomen non-distended Musculoskeletal: Normal ROM of UEs  Skin: No obvious rashes/open sores  Neurologic: CN 2-12 grossly intact Psychiatric: Answered questions appropriately with normal affect  Hematologic/Lymphatic/Immunologic: No obvious bruises or sites of spontaneous bleeding  Urine microscopy: 6-10 WBC/hpf, 3-10 RBC/hpf, moderate bacteria PVR: 0 ml  ASSESSMENT Left ureteral stone - Plan: Urine culture, DG Abd 1 View, Ambulatory Referral For Surgery Scheduling  Kidney stones - Plan: Urinalysis, Routine w reflex microscopic, BLADDER SCAN AMB NON-IMAGING, Urine culture, DG Abd 1 View, Ambulatory Referral For Surgery Scheduling  Hydronephrosis of left kidney - Plan: Urine culture, DG Abd 1 View, Ambulatory Referral For Surgery Scheduling  Left flank pain - Plan: Urine culture, DG Abd 1 View, Ambulatory Referral For Surgery Scheduling  Abnormal urinalysis - Plan: Urine culture  Urine culture sent.  For acute GU stone symptoms we agreed to proceed with: - Flomax  0.4 mg daily for medical expulsive  therapy (MET), which may improve passage of stone(s).  - For pain management, we discussed the use of opioids versus OTC analgesics.  - Zofran for nausea / vomiting.  We discussed the various treatment options including extracorporeal shock wave lithotripsy (ESWL) or ureteroscopic stone manipulation (URS). We discussed possible risks and benefits of intervention including but not limited to: including pain, infection, sepsis, UTI, ureter perforation, need for stenting, post-op ureteral stricture, hematuria. Agreed to proceed with ESWL.   She verbalized understanding and agreement. All questions were answered.   PLAN Advised the following: Urine culture sent. KUB today. Analgesics PRN for pain. Zofran PRN for nausea. Return for surgery.  Orders Placed This Encounter  Procedures   Urine culture   DG Abd 1 View    Standing Status:   Future    Expected Date:   03/05/2024    Expiration Date:   03/05/2025    Reason for Exam (SYMPTOM  OR DIAGNOSIS REQUIRED):   kidney stone    Is patient pregnant?:   Unknown (Please Explain)    Preferred imaging location?:   [facility]   Urinalysis, Routine w reflex microscopic  Ambulatory Referral For Surgery Scheduling    Referral Priority:   Urgent    Referral Type:   Consultation    Referred to Provider:   Sherrilee Belvie CROME, MD    Number of Visits Requested:   1   BLADDER SCAN AMB NON-IMAGING    It has been explained that the patient is to follow regularly with their PCP in addition to all other providers involved in their care and to follow instructions provided by these respective offices. Patient advised to contact urology clinic if any urologic-pertaining questions, concerns, new symptoms or problems arise in the interim period.  There are no Patient Instructions on file for this visit.  Electronically signed by: Lauraine KYM Oz, MSN, FNP-C, CUNP 03/05/2024 12:07 PM

## 2024-03-06 ENCOUNTER — Encounter (HOSPITAL_COMMUNITY)
Admission: RE | Disposition: A | Discharge status: Home / Self Care | Payer: Self-pay | Source: Home / Self Care | Attending: Urology

## 2024-03-06 ENCOUNTER — Ambulatory Visit (HOSPITAL_COMMUNITY)
Admission: RE | Admit: 2024-03-06 | Discharge: 2024-03-06 | Disposition: A | Discharge status: Home / Self Care | Payer: Self-pay | Attending: Urology | Admitting: Urology

## 2024-03-06 ENCOUNTER — Encounter (HOSPITAL_COMMUNITY): Payer: Self-pay | Admitting: Urology

## 2024-03-06 ENCOUNTER — Ambulatory Visit (HOSPITAL_COMMUNITY): Payer: Self-pay

## 2024-03-06 DIAGNOSIS — F17210 Nicotine dependence, cigarettes, uncomplicated: Secondary | ICD-10-CM | POA: Insufficient documentation

## 2024-03-06 DIAGNOSIS — N132 Hydronephrosis with renal and ureteral calculous obstruction: Secondary | ICD-10-CM | POA: Insufficient documentation

## 2024-03-06 DIAGNOSIS — N201 Calculus of ureter: Secondary | ICD-10-CM

## 2024-03-06 DIAGNOSIS — R8290 Unspecified abnormal findings in urine: Secondary | ICD-10-CM | POA: Insufficient documentation

## 2024-03-06 HISTORY — PX: EXTRACORPOREAL SHOCK WAVE LITHOTRIPSY: SHX1557

## 2024-03-06 SURGERY — LITHOTRIPSY, ESWL
Anesthesia: LOCAL | Laterality: Left

## 2024-03-06 MED ORDER — SODIUM CHLORIDE 0.9 % IV SOLN: INTRAVENOUS | Status: DC

## 2024-03-06 MED ORDER — DIPHENHYDRAMINE HCL 25 MG PO CAPS
25.0000 mg | ORAL_CAPSULE | ORAL | Status: AC
Start: 2024-03-06 — End: 2024-03-06
  Administered 2024-03-06: 25 mg via ORAL
  Filled 2024-03-06: qty 1

## 2024-03-06 MED ORDER — DIAZEPAM 5 MG PO TABS
10.0000 mg | ORAL_TABLET | Freq: Once | ORAL | Status: AC
  Administered 2024-03-06: 10 mg via ORAL
  Filled 2024-03-06: qty 2

## 2024-03-06 MED ORDER — OXYCODONE-ACETAMINOPHEN 5-325 MG PO TABS
1.0000 | ORAL_TABLET | Freq: Four times a day (QID) | ORAL | 0 refills | Status: AC | PRN
Start: 2024-03-06 — End: ?

## 2024-03-06 MED ORDER — TAMSULOSIN HCL 0.4 MG PO CAPS: 0.4000 mg | ORAL_CAPSULE | Freq: Every day | ORAL | 0 refills | Status: DC

## 2024-03-06 MED ORDER — ONDANSETRON 4 MG PO TBDP: 4.0000 mg | ORAL_TABLET | Freq: Three times a day (TID) | ORAL | 0 refills | Status: AC | PRN

## 2024-03-06 NOTE — Interval H&P Note (Signed)
 History and Physical Interval Note:  03/06/2024 10:27 AM  Brittney Duncan  has presented today for surgery, with the diagnosis of left UPJ stone.  The various methods of treatment have been discussed with the patient and family. After consideration of risks, benefits and other options for treatment, the patient has consented to  Procedure(s): LITHOTRIPSY, ESWL (Left) as a surgical intervention.  The patient's history has been reviewed, patient examined, no change in status, stable for surgery.  I have reviewed the patient's chart and labs.  Questions were answered to the patient's satisfaction.     [name]

## 2024-03-07 ENCOUNTER — Encounter (HOSPITAL_COMMUNITY): Payer: Self-pay | Admitting: Urology

## 2024-03-07 ENCOUNTER — Ambulatory Visit: Payer: Self-pay | Admitting: Urology

## 2024-03-07 LAB — URINE CULTURE

## 2024-03-08 ENCOUNTER — Telehealth: Payer: Self-pay | Admitting: Urology

## 2024-03-08 NOTE — Telephone Encounter (Signed)
 Work note created please notify patient she may pick up at front desk

## 2024-03-08 NOTE — Telephone Encounter (Signed)
 Needs a Dr note for work. The medication knocks her out. She would like a note for June 30-July 6.

## 2024-03-08 NOTE — Telephone Encounter (Signed)
 Please advise if work not is appropriate- litho done on 07/01

## 2024-04-02 ENCOUNTER — Other Ambulatory Visit: Payer: Self-pay | Admitting: Urology

## 2024-04-04 ENCOUNTER — Encounter: Admitting: Urology
